# Patient Record
Sex: Male | Born: 1985 | Race: White | Hispanic: No | Marital: Single | Smoking: Former smoker
Health system: Southern US, Community
[De-identification: ages and names within clinical notes are randomized; demographics above are authoritative.]

## PROBLEM LIST (undated history)

## (undated) DIAGNOSIS — D332 Benign neoplasm of brain, unspecified: Secondary | ICD-10-CM

## (undated) HISTORY — PX: OTHER SURGICAL HISTORY: SHX169

## (undated) HISTORY — DX: Benign neoplasm of brain, unspecified: D33.2

## (undated) HISTORY — PX: BRAIN SURGERY: SHX531

---
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
## 2007-05-17 ENCOUNTER — Emergency Department (HOSPITAL_COMMUNITY): Admission: EM | Admit: 2007-05-17 | Discharge: 2007-05-17 | Payer: Self-pay | Admitting: Emergency Medicine

## 2009-02-17 ENCOUNTER — Emergency Department (HOSPITAL_COMMUNITY): Admission: EM | Admit: 2009-02-17 | Discharge: 2009-02-17 | Payer: Self-pay | Admitting: Emergency Medicine

## 2009-05-11 ENCOUNTER — Emergency Department (HOSPITAL_COMMUNITY): Admission: EM | Admit: 2009-05-11 | Discharge: 2009-05-11 | Payer: Self-pay | Admitting: Emergency Medicine

## 2014-06-15 ENCOUNTER — Encounter (HOSPITAL_COMMUNITY): Payer: Self-pay

## 2014-06-15 ENCOUNTER — Emergency Department (HOSPITAL_COMMUNITY): Payer: Self-pay

## 2014-06-15 ENCOUNTER — Emergency Department (HOSPITAL_COMMUNITY)
Admission: EM | Admit: 2014-06-15 | Discharge: 2014-06-15 | Disposition: A | Discharge status: Home / Self Care | Payer: Self-pay | Attending: Emergency Medicine | Admitting: Emergency Medicine

## 2014-06-15 DIAGNOSIS — Y998 Other external cause status: Secondary | ICD-10-CM | POA: Insufficient documentation

## 2014-06-15 DIAGNOSIS — Y9389 Activity, other specified: Secondary | ICD-10-CM | POA: Insufficient documentation

## 2014-06-15 DIAGNOSIS — Y9289 Other specified places as the place of occurrence of the external cause: Secondary | ICD-10-CM | POA: Insufficient documentation

## 2014-06-15 DIAGNOSIS — M25531 Pain in right wrist: Secondary | ICD-10-CM

## 2014-06-15 DIAGNOSIS — W01198A Fall on same level from slipping, tripping and stumbling with subsequent striking against other object, initial encounter: Secondary | ICD-10-CM | POA: Insufficient documentation

## 2014-06-15 DIAGNOSIS — Z720 Tobacco use: Secondary | ICD-10-CM | POA: Insufficient documentation

## 2014-06-15 DIAGNOSIS — S6991XA Unspecified injury of right wrist, hand and finger(s), initial encounter: Secondary | ICD-10-CM | POA: Insufficient documentation

## 2014-06-15 MED ORDER — IBUPROFEN 400 MG PO TABS
600.0000 mg | ORAL_TABLET | Freq: Once | ORAL | Status: AC
  Administered 2014-06-15: 600 mg via ORAL
  Filled 2014-06-15: qty 2

## 2014-06-15 NOTE — ED Notes (Signed)
Pt. Reports tried to catch self from fall with right arm x5 days. States initially no problems but continued to use arm and x3 days ago started having pain to posterior part of hand and right wrist/forearm. Swelling noted to right forearm. Reports tingling to fingers, denies complete loss of sensation, cap refill intact, radial pulse intact.

## 2014-06-15 NOTE — ED Notes (Signed)
Patient transported to X-ray 

## 2014-06-15 NOTE — ED Provider Notes (Signed)
CSN: [id number]     Arrival date & time 06/15/14  1522 History  This chart was scribed for non-physician practitioner, Al Corpus, PA-C, working with Orpah Greek, MD, by Jeanell Sparrow, ED Scribe. This patient was seen in room TR07C/TR07C and the patient's care was started at 4:14 PM.  Chief Complaint  Patient presents with  . Wrist Injury   Patient is a 29 y.o. male presenting with wrist injury. The history is provided by the patient. No language interpreter was used.  Wrist Injury Associated symptoms: no fever    HPI Comments: Justin Lynch is a 29 y.o. male who presents to the Emergency Department complaining of right wrist injury that occurred about 5 days ago. He reports that he playing with his dog when he fell over the dog, hit a tree, and put out his right hand before he hit the ground 5 days ago. He states the he did not have any pain until 3 days ago when he started having constant moderate right wrist pain with swelling. He states that activity with his right hand exacerbates the pain. He reports taking ibuprofen with some relief. He reports having some tingling in his right wrist. 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 He denies any complete loss of sensation, weakness, or any other symptoms.   History reviewed. No pertinent past medical history. Past Surgical History  Procedure Laterality Date  . Brain tumor removal     No family history on file. History  Substance Use Topics  . Smoking status: Current Some Day Smoker  . Smokeless tobacco: Not on file  . Alcohol Use: No    Review of Systems  Constitutional: Negative for fever and chills.  Musculoskeletal: Positive for myalgias, joint swelling and arthralgias.  Neurological: Negative for weakness and numbness.     Allergies  Review of patient's allergies indicates no known allergies.  Home Medications   Prior to Admission medications   Medication Sig Start Date End Date  Taking? Authorizing Provider  ibuprofen (ADVIL,MOTRIN) 800 MG tablet Take 800 mg by mouth every 8 (eight) hours as needed.   Yes Historical Provider, MD   BP 124/54 mmHg  Pulse 47  Temp(Src) 97.9 F (36.6 C)  Resp 16  Ht 6\' 2"  (1.88 m)  Wt 212 lb (96.163 kg)  BMI 27.21 kg/m2  SpO2 99% Physical Exam  Constitutional: He appears well-developed and well-nourished. No distress.  HENT:  Head: Normocephalic and atraumatic.  Eyes: Conjunctivae are normal. Right eye exhibits no discharge. Left eye exhibits no discharge.  Cardiovascular:  Discussed radial pulses equal bilaterally.  Pulmonary/Chest: Effort normal. No respiratory distress.  Musculoskeletal:  Diffuse TTP of right dorsal wrist and thumb including  right scaphoid.  Full range of motion. No swelling or erythema no red streaks.  Neurological: He is alert. Coordination normal.  5/5 strength in bilateral upper extremity sensation intact.   Skin: He is not diaphoretic.  Psychiatric: He has a normal mood and affect. His behavior is normal.  Nursing note and vitals reviewed.   ED Course  Procedures (including critical care time) DIAGNOSTIC STUDIES: Oxygen Saturation is 99% on RA, normal by my interpretation.    COORDINATION OF CARE: 4:18 PM- Pt advised of plan for treatment which includes medication and radiology and pt agrees.  Labs Review Labs Reviewed - No data to display  Imaging Review Dg Forearm Right  06/15/2014   CLINICAL DATA:  [place] 5 days ago.  Persistent pain and swelling.  EXAM: RIGHT WRIST - COMPLETE 3+ VIEW; RIGHT FOREARM - 2 VIEW  COMPARISON:  Wrist films from 2005.  FINDINGS: Right forearm: The wrist and elbow joints are maintained. No acute forearm fracture.  Right wrist: The joint spaces are maintained. No acute fracture is identified. Small density projecting off the dorsal aspect of the first carpal row appears smoothly marginated and well corticated. This is likely a remote avulsion fracture.  IMPRESSION: No  acute fracture.  Probable remote triquetrum avulsion fracture.   Electronically Signed   By: Marijo Sanes M.D.   On: 06/15/2014 16:59   Dg Wrist Complete Right  06/15/2014   CLINICAL DATA:  [place] 5 days ago.  Persistent pain and swelling.  EXAM: RIGHT WRIST - COMPLETE 3+ VIEW; RIGHT FOREARM - 2 VIEW  COMPARISON:  Wrist films from 2005.  FINDINGS: Right forearm: The wrist and elbow joints are maintained. No acute forearm fracture.  Right wrist: The joint spaces are maintained. No acute fracture is identified. Small density projecting off the dorsal aspect of the first carpal row appears smoothly marginated and well corticated. This is likely a remote avulsion fracture.  IMPRESSION: No acute fracture.  Probable remote triquetrum avulsion fracture.   Electronically Signed   By: Marijo Sanes M.D.   On: 06/15/2014 16:59   Dg Hand Complete Right  06/15/2014   CLINICAL DATA:  Acute right hand pain and swelling after fall 5 days ago. Initial encounter.  EXAM: RIGHT HAND - COMPLETE 3+ VIEW  COMPARISON:  None.  FINDINGS: There is no evidence of fracture or dislocation. There is no evidence of arthropathy or other focal bone abnormality. Soft tissues are unremarkable.  IMPRESSION: Normal right hand.   Electronically Signed   By: Marijo Conception, M.D.   On: 06/15/2014 16:58     EKG Interpretation None      MDM   Final diagnoses:  Right wrist pain  Right wrist injury, initial encounter   Patient X-Ray negative for obvious fracture or dislocation. Pain managed in ED. Pt without erythema, edema or warmth to joint. Neurovascularly intact. I doubt septic arthritis. Patient with tenderness to right scaphoid however he is 5 days out from injury and there is no radiological evidence of scaphoid fracture and I doubt injury. Patient given brace and follow up with Ortho/hands for persistent symptoms.  Patient given brace while in ED, RICE, ibuprofen and conservative therapy recommended and discussed.   Discussed  return precautions with patient. Discussed all results and patient verbalizes understanding and agrees with plan.  I personally performed the services described in this documentation, which was scribed in my presence. The recorded information has been reviewed and is accurate.   Al Corpus, PA-C 06/15/14 [place], MD 06/15/14 [phone]

## 2014-06-15 NOTE — Discharge Instructions (Signed)
Return to the emergency room with worsening of symptoms, new symptoms or with symptoms that are concerning, especially numbness, tingling, weakness, fevers, redness, swelling, red streaks. RICE: Rest, Ice (three cycles of 20 mins on, 63mins off at least twice a day), compression/brace, elevation. Heating pad works well for back pain. Ibuprofen 400mg  (2 tablets 200mg ) every 5-6 hours for 3-5 days. Wear wrist brace as much as possible. Follow up with PCP/orthopedist if symptoms worsen or are persistent. Read below information and follow recommendations. Wrist Pain Wrist injuries are frequent in adults and children. A sprain is an injury to the ligaments that hold your bones together. A strain is an injury to muscle or muscle cord-like structures (tendons) from stretching or pulling. Generally, when wrists are moderately tender to touch following a fall or injury, a break in the bone (fracture) may be present. Most wrist sprains or strains are better in 3 to 5 days, but complete healing may take several weeks. HOME CARE INSTRUCTIONS   Put ice on the injured area.  Put ice in a plastic bag.  Place a towel between your skin and the bag.  Leave the ice on for 15-20 minutes, 3-4 times a day, for the first 2 days, or as directed by your health care provider.  Keep your arm raised above the level of your heart whenever possible to reduce swelling and pain.  Rest the injured area for at least 48 hours or as directed by your health care provider.  If a splint or elastic bandage has been applied, use it for as long as directed by your health care provider or until seen by a health care provider for a follow-up exam.  Only take over-the-counter or prescription medicines for pain, discomfort, or fever as directed by your health care provider.  Keep all follow-up appointments. You may need to follow up with a specialist or have follow-up X-rays. Improvement in pain level is not a guarantee that you did  not fracture a bone in your wrist. The only way to determine whether or not you have a broken bone is by X-ray. SEEK IMMEDIATE MEDICAL CARE IF:   Your fingers are swollen, very red, white, or cold and blue.  Your fingers are numb or tingling.  You have increasing pain.  You have difficulty moving your fingers. MAKE SURE YOU:   Understand these instructions.  Will watch your condition.  Will get help right away if you are not doing well or get worse. Document Released: 12/07/2004 Document Revised: 03/04/2013 Document Reviewed: 04/20/2010 [facility] Patient Information 2015 [place], [place]. This information is not intended to replace advice given to you by your health care provider. Make sure you discuss any questions you have with your health care provider.

## 2014-06-15 NOTE — ED Notes (Signed)
Returned from XRAY

## 2015-08-25 ENCOUNTER — Ambulatory Visit (INDEPENDENT_AMBULATORY_CARE_PROVIDER_SITE_OTHER): Payer: Managed Care, Other (non HMO) | Admitting: Physician Assistant

## 2015-08-25 VITALS — BP 140/80 | HR 61 | Temp 97.5°F | Resp 15 | Ht 74.0 in | Wt 205.0 lb

## 2015-08-25 DIAGNOSIS — K0889 Other specified disorders of teeth and supporting structures: Secondary | ICD-10-CM

## 2015-08-25 MED ORDER — IBUPROFEN 800 MG PO TABS: 800.0000 mg | ORAL_TABLET | Freq: Three times a day (TID) | ORAL | Status: AC | PRN

## 2015-08-25 MED ORDER — TRAMADOL HCL 50 MG PO TABS: 50.0000 mg | ORAL_TABLET | Freq: Three times a day (TID) | ORAL | Status: DC | PRN

## 2015-08-25 NOTE — Progress Notes (Signed)
[facility] [address] [place] [address] [phone]  Date:  08/25/2015   Name:  Justin Lynch   DOB:  1985-07-08   MRN:  [id number]  PCP:  No primary care provider on file.    History of Present Illness:  Justin Lynch is a 30 y.o. male patient who presents to [facility] for cc dental pain.   Patient states that he has had 7-8 months of dental pain.  This has wax and waned.  He states that he was eating last night when he bit down on a broken tooth.  Pain was instant, and radiated up into his upper left cheek.  He has attempted aleve without much relief.  No swelling.  He has had no fever.     There are no active problems to display for this patient.   Past Medical History  Diagnosis Date  . Brain tumor (benign) [facility])     Past Surgical History  Procedure Laterality Date  . Brain tumor removal    . Brain surgery      Social History  Substance Use Topics  . Smoking status: Current Some Day Smoker  . Smokeless tobacco: None  . Alcohol Use: No    No family history on file.  No Known Allergies  Medication list has been reviewed and updated.  No current outpatient prescriptions on file prior to visit.   No current facility-administered medications on file prior to visit.    ROS ROS otherwise unremarkable unless listed above.   Physical Examination: BP 140/80 mmHg  Pulse 61  Temp(Src) 97.5 F (36.4 C) (Oral)  Resp 15  Ht 6\' 2"  (1.88 m)  Wt 205 lb (92.987 kg)  BMI 26.31 kg/m2  SpO2 98% Ideal Body Weight: Weight in (lb) to have BMI = 25: 194.3  Physical Exam  Constitutional: He is oriented to person, place, and time. He appears well-developed and well-nourished. No distress.  HENT:  Head: Normocephalic and atraumatic.  Mouth/Throat: Abnormal dentition. Dental caries present. No uvula swelling. No oropharyngeal exudate, posterior oropharyngeal edema or posterior oropharyngeal erythema.  Some tenderness at the left upper back molar.   Eyes: Conjunctivae and EOM are normal. Pupils are equal, round, and reactive to light.  Cardiovascular: Normal rate.   Pulmonary/Chest: Effort normal. No respiratory distress.  Neurological: He is alert and oriented to person, place, and time.  Skin: Skin is warm and dry. He is not diaphoretic.  Psychiatric: He has a normal mood and affect. His behavior is normal.     Assessment and Plan: Justin Lynch is a 30 y.o. male who is here today for  Tooth pain. -ibuprofen given today.  Declines anything stronger.  I have advised some choices for dental visit.    1. Tooth pain - ibuprofen (ADVIL,MOTRIN) 800 MG tablet; Take 1 tablet (800 mg total) by mouth every 8 (eight) hours as needed.  Dispense: 60 tablet; Refill: 0   Ivar Drape, PA-C [facility] 08/25/2015 3:02 PM

## 2015-08-25 NOTE — Patient Instructions (Addendum)
     IF you received an x-ray today, you will receive an invoice from [facility]. Please contact [facility] at [phone] with questions or concerns regarding your invoice.   IF you received labwork today, you will receive an invoice from [employer]. Please contact [facility] at [phone] with questions or concerns regarding your invoice.   Our billing staff will not be able to assist you with questions regarding bills from these companies.  You will be contacted with the lab results as soon as they are available. The fastest way to get your results is to activate your My Chart account. Instructions are located on the last page of this paperwork. If you have not heard from [place] regarding the results in 2 weeks, please contact this office.    Dental Pain Dental pain may be caused by many things, including:  Tooth decay (cavities or caries). Cavities expose the nerve of your tooth to air and hot or cold temperatures. This can cause pain or discomfort.  Abscess or infection. A dental abscess is a collection of infected pus from a bacterial infection in the inner part of the tooth (pulp). It usually occurs at the end of the tooth's root.  Injury.  An unknown reason (idiopathic). Your pain may be mild or severe. It may only occur when:  You are chewing.  You are exposed to hot or cold temperature.  You are eating or drinking sugary foods or beverages, such as soda or candy. Your pain may also be constant. HOME CARE INSTRUCTIONS Watch your dental pain for any changes. The following actions may help to lessen any discomfort that you are feeling:  Take medicines only as directed by your dentist.  If you were prescribed an antibiotic medicine, finish all of it even if you start to feel better.  Keep all follow-up visits as directed by your dentist. This is important.  Do not apply heat to the outside of your face.  Rinse your mouth or  gargle with salt water if directed by your dentist. This helps with pain and swelling.  You can make salt water by adding  tsp of salt to 1 cup of warm water.  Apply ice to the painful area of your face:  Put ice in a plastic bag.  Place a towel between your skin and the bag.  Leave the ice on for 20 minutes, 2-3 times per day.  Avoid foods or drinks that cause you pain, such as:  Very hot or very cold foods or drinks.  Sweet or sugary foods or drinks. SEEK MEDICAL CARE IF:  Your pain is not controlled with medicines.  Your symptoms are worse.  You have new symptoms. SEEK IMMEDIATE MEDICAL CARE IF:  You are unable to open your mouth.  You are having trouble breathing or swallowing.  You have a fever.  Your face, neck, or jaw is swollen.   This information is not intended to replace advice given to you by your health care provider. Make sure you discuss any questions you have with your health care provider.   Document Released: 02/27/2005 Document Revised: 07/14/2014 Document Reviewed: 02/23/2014 Elsevier Interactive Patient Education [employer].

## 2015-11-13 ENCOUNTER — Encounter (HOSPITAL_COMMUNITY): Payer: Self-pay | Admitting: Nurse Practitioner

## 2015-11-13 ENCOUNTER — Inpatient Hospital Stay (HOSPITAL_COMMUNITY): Payer: Managed Care, Other (non HMO)

## 2015-11-13 ENCOUNTER — Inpatient Hospital Stay (HOSPITAL_COMMUNITY)
Admission: EM | Admit: 2015-11-13 | Discharge: 2015-11-15 | DRG: 201 | Disposition: A | Discharge status: Home / Self Care | Payer: Managed Care, Other (non HMO) | Attending: Pulmonary Disease | Admitting: Pulmonary Disease

## 2015-11-13 ENCOUNTER — Emergency Department (HOSPITAL_COMMUNITY): Payer: Managed Care, Other (non HMO)

## 2015-11-13 DIAGNOSIS — J9600 Acute respiratory failure, unspecified whether with hypoxia or hypercapnia: Secondary | ICD-10-CM

## 2015-11-13 DIAGNOSIS — J939 Pneumothorax, unspecified: Secondary | ICD-10-CM

## 2015-11-13 DIAGNOSIS — J9383 Other pneumothorax: Secondary | ICD-10-CM | POA: Diagnosis present

## 2015-11-13 DIAGNOSIS — R0602 Shortness of breath: Secondary | ICD-10-CM | POA: Diagnosis present

## 2015-11-13 DIAGNOSIS — J9311 Primary spontaneous pneumothorax: Secondary | ICD-10-CM | POA: Diagnosis present

## 2015-11-13 DIAGNOSIS — Z86011 Personal history of benign neoplasm of the brain: Secondary | ICD-10-CM

## 2015-11-13 DIAGNOSIS — Z91048 Other nonmedicinal substance allergy status: Secondary | ICD-10-CM

## 2015-11-13 DIAGNOSIS — F17200 Nicotine dependence, unspecified, uncomplicated: Secondary | ICD-10-CM | POA: Diagnosis present

## 2015-11-13 LAB — BASIC METABOLIC PANEL
ANION GAP: 10 (ref 5–15)
BUN: 17 mg/dL (ref 6–20)
CALCIUM: 9.4 mg/dL (ref 8.9–10.3)
CHLORIDE: 107 mmol/L (ref 101–111)
CO2: 23 mmol/L (ref 22–32)
Creatinine, Ser: 0.7 mg/dL (ref 0.61–1.24)
GFR calc non Af Amer: >60 mL/min (ref >60)
GLUCOSE: 108 mg/dL — AB (ref 65–99)
POTASSIUM: 3.6 mmol/L (ref 3.5–5.1)
Sodium: 140 mmol/L (ref 135–145)

## 2015-11-13 LAB — CBC
HEMATOCRIT: 40.7 % (ref 39.0–52.0)
HEMOGLOBIN: 13.6 g/dL (ref 13.0–17.0)
MCH: 30.6 pg (ref 26.0–34.0)
MCHC: 33.4 g/dL (ref 30.0–36.0)
MCV: 91.5 fL (ref 78.0–100.0)
Platelets: 327 10*3/uL (ref 150–400)
RBC: 4.45 MIL/uL (ref 4.22–5.81)
RDW: 12.6 % (ref 11.5–15.5)
WBC: 8.1 10*3/uL (ref 4.0–10.5)

## 2015-11-13 LAB — I-STAT TROPONIN, ED: TROPONIN I, POC: 0 ng/mL (ref 0.00–0.08)

## 2015-11-13 MED ORDER — FENTANYL CITRATE (PF) 100 MCG/2ML IJ SOLN
INTRAMUSCULAR | Status: AC
  Filled 2015-11-13: qty 2

## 2015-11-13 MED ORDER — SODIUM CHLORIDE 0.9 % IV SOLN
INTRAVENOUS | Status: DC
  Administered 2015-11-13 – 2015-11-14 (×3): via INTRAVENOUS

## 2015-11-13 MED ORDER — HYDROCODONE-ACETAMINOPHEN 5-325 MG PO TABS
1.0000 | ORAL_TABLET | ORAL | Status: DC | PRN
  Administered 2015-11-13 (×2): 2 via ORAL
  Filled 2015-11-13 (×2): qty 2

## 2015-11-13 MED ORDER — KETAMINE HCL-SODIUM CHLORIDE 100-0.9 MG/10ML-% IV SOSY
0.3000 mg/kg | PREFILLED_SYRINGE | Freq: Once | INTRAVENOUS | Status: AC
  Administered 2015-11-13: 20 mg via INTRAVENOUS
  Filled 2015-11-13: qty 10

## 2015-11-13 MED ORDER — NICOTINE 21 MG/24HR TD PT24
21.0000 mg | MEDICATED_PATCH | Freq: Every day | TRANSDERMAL | Status: DC
  Administered 2015-11-13 – 2015-11-15 (×3): 21 mg via TRANSDERMAL
  Filled 2015-11-13 (×3): qty 1

## 2015-11-13 MED ORDER — FENTANYL CITRATE (PF) 100 MCG/2ML IJ SOLN
25.0000 ug | Freq: Once | INTRAMUSCULAR | Status: AC
  Administered 2015-11-13: 25 ug via INTRAVENOUS

## 2015-11-13 MED ORDER — LIDOCAINE-EPINEPHRINE (PF) 2 %-1:200000 IJ SOLN
20.0000 mL | Freq: Once | INTRAMUSCULAR | Status: AC
  Administered 2015-11-13: 20 mL via INTRADERMAL
  Filled 2015-11-13: qty 20

## 2015-11-13 MED ORDER — FENTANYL CITRATE (PF) 100 MCG/2ML IJ SOLN
25.0000 ug | INTRAMUSCULAR | Status: DC | PRN
  Administered 2015-11-13 – 2015-11-15 (×2): 25 ug via INTRAVENOUS
  Filled 2015-11-13 (×4): qty 2

## 2015-11-13 NOTE — ED Notes (Signed)
Returned from X-ray and hooked up to monitor. Dr. Kathrynn Humble at bedside.

## 2015-11-13 NOTE — ED Notes (Signed)
MD at bedside. 

## 2015-11-13 NOTE — Sedation Documentation (Signed)
Parents at bedside.  Pt alert and denying pain.  Tolerated procedure x 2 very well.

## 2015-11-13 NOTE — ED Notes (Signed)
Patient transported to X-ray 

## 2015-11-13 NOTE — H&P (Signed)
Name: Justin Lynch MRN: [id number] DOB: 09/11/1985    ADMISSION DATE:  11/13/2015  REFERRING MD :  EDP (nanavati)   CHIEF COMPLAINT:  Spontaneous Ptx   BRIEF PATIENT DESCRIPTION: 30yo male with remote hx brain tumor (benign) s/p resection presented 9/2 to [facility] ER with sudden onset L chest and arm pain as well as SOB.  CXR revealed large spontaneous ptx.  L pigtail chest tube placed by EDP and PCCM consulted to admit.   SIGNIFICANT EVENTS  9/2 L ptx>> L pigtail placed   STUDIES:  9/2 CXR>>> Large left pneumothorax extending from the apex to the base.   HISTORY OF PRESENT ILLNESS:  30yo male with remote hx brain tumor (benign) s/p resection presented 9/2 to [facility] ER with sudden onset L chest and arm pain as well as SOB.  CXR revealed large spontaneous ptx.  L pigtail chest tube placed by EDP and PCCM consulted to admit.   No hx trauma.  C/o mild SOB, dizziness, pain at CT site.  Denies cough, hemoptysis, fever, syncope.  Current smoker - tobacco and marijuana.     PAST MEDICAL HISTORY :   has a past medical history of Brain tumor (benign) ([place]).  has a past surgical history that includes brain tumor removal and Brain surgery. Prior to Admission medications   Not on File   No Known Allergies  FAMILY HISTORY:  family history is not on file. SOCIAL HISTORY:  reports that he has been smoking.  He has never used smokeless tobacco. He reports that he uses drugs, including Marijuana. He reports that he does not drink alcohol.  REVIEW OF SYSTEMS:   As per HPI - All other systems reviewed and were neg.    SUBJECTIVE:   VITAL SIGNS: Temp:  [98.2 F (36.8 C)] 98.2 F (36.8 C) (09/02 1111) Pulse Rate:  [41-56] 46 (09/02 1415) Resp:  [9-23] 16 (09/02 1415) BP: (125-153)/(72-97) 149/84 (09/02 1415) SpO2:  [95 %-100 %] 100 % (09/02 1415) Weight:  [93 kg (205 lb 0.4 oz)] 93 kg (205 lb 0.4 oz) (09/02 1149)  PHYSICAL EXAMINATION: General:  Pleasant young male, NAD  Neuro:  Awake,  alert, appropriate, MAE  HEENT:  Mm moist, no JVD  Cardiovascular:  s1s2 rrr, brady  Lungs:  resps even non labored on RA, clear bilat  Abdomen:  Round, soft, +bs  Musculoskeletal:  Warm and dry, no edema    Recent Labs Lab 11/13/15 1115  NA 140  K 3.6  CL 107  CO2 23  BUN 17  CREATININE 0.70  GLUCOSE 108*    Recent Labs Lab 11/13/15 1115  HGB 13.6  HCT 40.7  WBC 8.1  PLT 327   Dg Chest 2 View  Result Date: 11/13/2015 CLINICAL DATA:  Left chest pain and shortness of breath EXAM: CHEST  2 VIEW COMPARISON:  None. Patient's prior chest x-ray is not available for comparison. FINDINGS: The mediastinal contour and cardiac silhouette are normal. There is a large left pneumothorax extending from the apex to the base. The right lung is normal. The bones are normal. IMPRESSION: Large left pneumothorax extending from the apex to the base. These results were called by telephone at the time of interpretation on 11/13/2015 at 12:13 pm to Dr. Varney Biles , who verbally acknowledged these results. Electronically Signed   By: Abelardo Diesel M.D.   On: 11/13/2015 12:16    ASSESSMENT / PLAN:  Spontaneous LEFT pneumothorax - No ptx on post chest tube film  Tobacco abuse   PLAN -  Admit to floor  Chest tube to 20cm suction  CXR in am  Pain control  Smoking cessation discussed    Justin Madrid, NP 11/13/2015  2:45 PM Pager: [phone] or [phone]  Attending note: I have seen and examined the patient with nurse practitioner/resident and agree with the note. History, labs and imaging reviewed.  30 Y/O with PMH of remote benign brain tumor s/p resection, smoker admitted with Lt spontaneous pneumothorax s/p pigtail placement by ED. Pt is stable. He will be admitted to hospital Continue CT to 20 cm suction.  Repeat CXR in AM.  Justin Garfinkel MD [facility] Pager [phone] If no answer or after 3pm call: [phone] 11/13/2015, 3:52 PM

## 2015-11-13 NOTE — ED Provider Notes (Signed)
[facility] DEPT Provider Note   CSN: [id number] Arrival date & time: 11/13/15  1105     History   Chief Complaint Chief Complaint  Patient presents with  . Chest Pain    HPI Justin Lynch is a 30 y.o. male.  HPI 30yo male with remote hx brain tumor (benign) s/p resection presents to the ER with sudden onset L chest and arm pain as well as SOB. Pt reports that when he woke up he was doing well, but while at work he started having chest pain, which was getting worse and started affecting his shoulder, so he decided to come to the ER. Pt has no asthma/copd hx, denies drug use. No heavy smoking. Pt denies trauma.  Past Medical History:  Diagnosis Date  . Brain tumor (benign) [facility])     Patient Active Problem List   Diagnosis Date Noted  . Spontaneous pneumothorax 11/13/2015    Past Surgical History:  Procedure Laterality Date  . BRAIN SURGERY    . brain tumor removal         Home Medications    Prior to Admission medications   Medication Sig Start Date End Date Taking? Authorizing Provider  ibuprofen (ADVIL,MOTRIN) 800 MG tablet Take 800 mg by mouth daily as needed (stress headache).   Yes Historical Provider, MD    Family History History reviewed. No pertinent family history.  Social History Social History  Substance Use Topics  . Smoking status: Current Some Day Smoker  . Smokeless tobacco: Never Used  . Alcohol use No     Allergies   Powder   Review of Systems Review of Systems  ROS 10 Systems reviewed and are negative for acute change except as noted in the HPI.     Physical Exam Updated Vital Signs BP 140/77 (BP Location: Right Arm)   Pulse (!) 51   Temp 98.8 F (37.1 C) (Oral)   Resp 19   Ht 6' 2.02" (1.88 m)   Wt 205 lb 0.4 oz (93 kg)   SpO2 98%   BMI 26.31 kg/m   Physical Exam  Constitutional: He is oriented to person, place, and time. He appears well-developed.  HENT:  Head: Atraumatic.  Neck: Neck supple.    Cardiovascular: Normal rate.   Pulmonary/Chest: Effort normal. No respiratory distress. He has no wheezes.  Diminished breath sounds on the L side.  Neurological: He is alert and oriented to person, place, and time.  Skin: Skin is warm.  Nursing note and vitals reviewed.    ED Treatments / Results  Labs (all labs ordered are listed, but only abnormal results are displayed) Labs Reviewed  BASIC METABOLIC PANEL - Abnormal; Notable for the following:       Result Value   Glucose, Bld 108 (*)    All other components within normal limits  CBC  I-STAT TROPOININ, ED    EKG  EKG Interpretation None       Radiology Dg Chest 2 View  Result Date: 11/13/2015 CLINICAL DATA:  Left chest pain and shortness of breath EXAM: CHEST  2 VIEW COMPARISON:  None. Patient's prior chest x-ray is not available for comparison. FINDINGS: The mediastinal contour and cardiac silhouette are normal. There is a large left pneumothorax extending from the apex to the base. The right lung is normal. The bones are normal. IMPRESSION: Large left pneumothorax extending from the apex to the base. These results were called by telephone at the time of interpretation on 11/13/2015 at 12:13 pm  to Dr. Varney Biles , who verbally acknowledged these results. Electronically Signed   By: Abelardo Diesel M.D.   On: 11/13/2015 12:16   Dg Chest Port 1 View  Result Date: 11/13/2015 CLINICAL DATA:  Follow-up pneumothorax and chest tube placement. EXAM: PORTABLE CHEST 1 VIEW COMPARISON:  11/13/2015 FINDINGS: Mild cardiomegaly again noted. A left pleural catheter has been minimally retracted since the prior study. Slightly increasing left lateral subcutaneous emphysema identified. There is no evidence of pneumothorax. Mild left basilar atelectasis is present. IMPRESSION: Left pleural catheter has been minimally retracted since the prior study with slightly increasing left lateral subcutaneous emphysema. No evidence of pneumothorax. Mild  cardiomegaly with mild left basilar atelectasis. Electronically Signed   By: Margarette Canada M.D.   On: 11/13/2015 16:01   Dg Chest Port 1 View  Result Date: 11/13/2015 CLINICAL DATA:  Followup pneumothorax. EXAM: PORTABLE CHEST 1 VIEW COMPARISON:  Earlier film, same date. FINDINGS: There is a pigtail type drainage catheter in the left pleural space a small residual left-sided pneumothorax estimated at 10%. Left lower lobe atelectasis is noted. Small amount of subcutaneous emphysema. The right lung remains clear. IMPRESSION: Re-expansion of the left lung after placement of a pleural drainage catheter. There is a small residual pneumothorax estimated at 10%. Streaky areas of atelectasis in the left lung Electronically Signed   By: Marijo Sanes M.D.   On: 11/13/2015 14:44    Procedures .Critical Care Performed by: Varney Biles Authorized by: Varney Biles   Critical care provider statement:    Critical care time (minutes):  40   Critical care time was exclusive of:  Separately billable procedures and treating other patients   Critical care was necessary to treat or prevent imminent or life-threatening deterioration of the following conditions:  Respiratory failure   Critical care was time spent personally by me on the following activities:  Blood draw for specimens, development of treatment plan with patient or surrogate, discussions with consultants, evaluation of patient's response to treatment, examination of patient, obtaining history from patient or surrogate, ordering and performing treatments and interventions, ordering and review of laboratory studies, ordering and review of radiographic studies, pulse oximetry and re-evaluation of patient's condition CHEST TUBE INSERTION Date/Time: 11/13/2015 5:02 PM Performed by: Varney Biles Authorized by: Varney Biles   Consent:    Consent obtained:  Written   Consent given by:  Patient   Risks discussed:  Bleeding, incomplete drainage, nerve  damage, infection and damage to surrounding structures   Alternatives discussed:  Alternative treatment Universal protocol:    Procedure explained and questions answered to patient or proxy's satisfaction: yes     Relevant documents present and verified: yes     Imaging studies available: yes     Site/side marked: yes     Immediately prior to procedure a time out was called: yes   Pre-procedure details:    Skin preparation:  ChloraPrep Anesthesia (see MAR for exact dosages):    Anesthesia method:  Local infiltration   Local anesthetic:  Lidocaine 2% WITH epi Procedure details:    Placement location:  L anterior   Scalpel size:  10   Tube size (Fr):  8   Ultrasound guidance: no     Tension pneumothorax: no     Tube connected to:  Suction   Drainage characteristics:  Air only   Suture material:  2-0 silk   Dressing:  Petrolatum-impregnated gauze and 4x4 sterile gauze Post-procedure details:    Post-insertion x-ray findings: tube in  good position     Patient tolerance of procedure:  Tolerated well, no immediate complications   (including critical care time)  Medications Ordered in ED Medications  0.9 %  sodium chloride infusion ( Intravenous Transfusing/Transfer 11/13/15 1547)  HYDROcodone-acetaminophen (NORCO/VICODIN) 5-325 MG per tablet 1-2 tablet (2 tablets Oral Given 11/13/15 1545)  nicotine (NICODERM CQ - dosed in mg/24 hours) patch 21 mg (21 mg Transdermal Patch Applied 11/13/15 1521)  lidocaine-EPINEPHrine (XYLOCAINE W/EPI) 2 %-1:200000 (PF) injection 20 mL (20 mLs Intradermal Given 11/13/15 1325)  ketamine 100 mg in normal saline 10 mL (10mg /mL) syringe (20 mg Intravenous Given 11/13/15 1416)  fentaNYL (SUBLIMAZE) injection 25 mcg (25 mcg Intravenous Given 11/13/15 1552)     Initial Impression / Assessment and Plan / ED Course  I have reviewed the triage vital signs and the nursing notes.  Pertinent labs & imaging results that were available during my care of the patient were  reviewed by me and considered in my medical decision making (see chart for details).  Clinical Course    Pt comes in with chest pain, dib and is noted to have a pneumothorax. PTX > 50% and > 3 cm, this intervention was mandated. PT also had severe chest pain and dib. We decided to use pig tail catheter, as it was primary spontaneous PTX. Pt tolerated the procedure well.  Pt aware that if the pig tail catheter doesn't work, he will need a 20 Fr tube thoracostomy. Pulmonary team admitting.   Final Clinical Impressions(s) / ED Diagnoses   Final diagnoses:  Primary spontaneous pneumothorax    New Prescriptions Current Discharge Medication List       Varney Biles, MD 11/13/15 1706

## 2015-11-13 NOTE — ED Triage Notes (Signed)
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 He is diaphoretic and feels SOB. Symptoms have been constant since onset. Nothing seems to relieve or aggravate the pain. He denies cough, fevers. He felt dizzy at onset of pain.

## 2015-11-13 NOTE — ED Notes (Signed)
Attempted report 

## 2015-11-13 NOTE — ED Notes (Signed)
Pt prepared and ready for chest tube insertion.  Pt signed consent.  Dr Kathrynn Humble and med student as well as RN and EDT at bedside.

## 2015-11-14 ENCOUNTER — Inpatient Hospital Stay (HOSPITAL_COMMUNITY): Payer: Managed Care, Other (non HMO)

## 2015-11-14 LAB — CBC
HEMATOCRIT: 35.8 % — AB (ref 39.0–52.0)
HEMOGLOBIN: 12 g/dL — AB (ref 13.0–17.0)
MCH: 30.5 pg (ref 26.0–34.0)
MCHC: 33.5 g/dL (ref 30.0–36.0)
MCV: 91.1 fL (ref 78.0–100.0)
Platelets: 275 10*3/uL (ref 150–400)
RBC: 3.93 MIL/uL — AB (ref 4.22–5.81)
RDW: 12.6 % (ref 11.5–15.5)
WBC: 9.8 10*3/uL (ref 4.0–10.5)

## 2015-11-14 MED ORDER — IBUPROFEN 200 MG PO TABS
100.0000 mg | ORAL_TABLET | Freq: Three times a day (TID) | ORAL | Status: DC | PRN
  Administered 2015-11-14 (×2): 100 mg via ORAL
  Filled 2015-11-14 (×2): qty 1

## 2015-11-14 MED ORDER — ONDANSETRON HCL 4 MG/2ML IJ SOLN
4.0000 mg | Freq: Four times a day (QID) | INTRAMUSCULAR | Status: DC | PRN
  Administered 2015-11-14: 4 mg via INTRAVENOUS
  Filled 2015-11-14: qty 2

## 2015-11-14 MED ORDER — IBUPROFEN 800 MG PO TABS
800.0000 mg | ORAL_TABLET | Freq: Three times a day (TID) | ORAL | Status: DC | PRN
  Filled 2015-11-14: qty 1

## 2015-11-14 NOTE — Progress Notes (Signed)
[facility] Progress Note Patient Name: Justin Lynch DOB: Dec 06, 1985 MRN: [id number]   Date of Service  11/14/2015  HPI/Events of Note  Pt takes advil at home prn and requesting full dose   eICU Interventions  Changed to advil 800 q 8 h prn      Intervention Category Minor Interventions: Routine modifications to care plan (e.g. PRN medications for pain, fever)  Christinia Gully 11/14/2015, 7:33 PM

## 2015-11-14 NOTE — Progress Notes (Signed)
Pt. did not wait for new order of pain med to be given, instead took pain med brought in from home. Pt. and family advised against this.

## 2015-11-14 NOTE — Progress Notes (Signed)
Name: Justin Lynch MRN: [id number] DOB: [birth year]    ADMISSION DATE:  11/13/2015  REFERRING MD :  EDP (nanavati)   CHIEF COMPLAINT:  Spontaneous Ptx   BRIEF PATIENT DESCRIPTION: 29yowm smoker  with remote hx brain tumor (benign) s/p resection presented 9/2 to [facility] ER with sudden onset L chest and arm pain as well as SOB.  CXR revealed large spontaneous ptx.  L pigtail chest tube placed by EDP and PCCM consulted to admit.   SIGNIFICANT EVENTS  9/2 L ptx>> L pigtail placed   STUDIES:      SUBJECTIVE:  [name] lying quietly in bed at 30 degrees HOB nad on RA / no appetite p nausea from vicodin > d/c'd  VITAL SIGNS: Temp:  [97.6 F (36.4 C)-98.8 F (37.1 C)] 97.6 F (36.4 C) (09/03 0546) Pulse Rate:  [41-56] 41 (09/03 0546) Resp:  [9-23] 18 (09/03 0546) BP: (115-153)/(60-97) 115/70 (09/03 0546) SpO2:  [95 %-100 %] 100 % (09/03 0546) Weight:  [205 lb 0.4 oz (93 kg)] 205 lb 0.4 oz (93 kg) (09/02 1149)  PHYSICAL EXAMINATION: General:  Pleasant young male, NAD  Neuro:  Awake, alert, appropriate, MAE  HEENT:  Mm moist, no JVD  Cardiovascular:  s1s2 rrr, brady  Lungs:  resps even non labored on RA, clear bilat / no significant ant crepitance  Abdomen:  Round, soft, +bs  Musculoskeletal:  Warm and dry, no edema    Recent Labs Lab 11/13/15 1115  NA 140  K 3.6  CL 107  CO2 23  BUN 17  CREATININE 0.70  GLUCOSE 108*    Recent Labs Lab 11/13/15 1115 11/14/15 0214  HGB 13.6 12.0*  HCT 40.7 35.8*  WBC 8.1 9.8  PLT 327 275   X-ray Chest Pa And Lateral  Result Date: 11/14/2015 CLINICAL DATA:  Spontaneous pneumothorax, left chest tube EXAM: CHEST  2 VIEW COMPARISON:  11/13/2015 FINDINGS: Stable left pigtail chest tube. No pneumothorax is seen. Moderate subcutaneous emphysema along the left lower chest wall. Right lung is clear. The heart is normal in size. IMPRESSION: Stable left pigtail chest tube with moderate subcutaneous emphysema. No pneumothorax is seen.  Electronically Signed   By: Julian Hy M.D.   On: 11/14/2015 08:13   Dg Chest 2 View  Result Date: 11/13/2015 CLINICAL DATA:  Left chest pain and shortness of breath EXAM: CHEST  2 VIEW COMPARISON:  None. Patient's prior chest x-ray is not available for comparison. FINDINGS: The mediastinal contour and cardiac silhouette are normal. There is a large left pneumothorax extending from the apex to the base. The right lung is normal. The bones are normal. IMPRESSION: Large left pneumothorax extending from the apex to the base. These results were called by telephone at the time of interpretation on 11/13/2015 at 12:13 pm to Dr. Varney Biles , who verbally acknowledged these results. Electronically Signed   By: Abelardo Diesel M.D.   On: 11/13/2015 12:16   Dg Chest Port 1 View  Result Date: 11/13/2015 CLINICAL DATA:  Follow-up pneumothorax and chest tube placement. EXAM: PORTABLE CHEST 1 VIEW COMPARISON:  11/13/2015 FINDINGS: Mild cardiomegaly again noted. A left pleural catheter has been minimally retracted since the prior study. Slightly increasing left lateral subcutaneous emphysema identified. There is no evidence of pneumothorax. Mild left basilar atelectasis is present. IMPRESSION: Left pleural catheter has been minimally retracted since the prior study with slightly increasing left lateral subcutaneous emphysema. No evidence of pneumothorax. Mild cardiomegaly with mild left basilar atelectasis. Electronically Signed  By: Margarette Canada M.D.   On: 11/13/2015 16:01   Dg Chest Port 1 View  Result Date: 11/13/2015 CLINICAL DATA:  Followup pneumothorax. EXAM: PORTABLE CHEST 1 VIEW COMPARISON:  Earlier film, same date. FINDINGS: There is a pigtail type drainage catheter in the left pleural space a small residual left-sided pneumothorax estimated at 10%. Left lower lobe atelectasis is noted. Small amount of subcutaneous emphysema. The right lung remains clear. IMPRESSION: Re-expansion of the left lung after  placement of a pleural drainage catheter. There is a small residual pneumothorax estimated at 10%. Streaky areas of atelectasis in the left lung Electronically Signed   By: Marijo Sanes M.D.   On: 11/13/2015 14:44    ASSESSMENT / PLAN:  Spontaneous LEFT pneumothorax - No ptx on post chest tube film  Cigarette smoker   PLAN -   Chest tube to 20cm suction  CXR in am  Pain control with nsaids/ d/c'd vicodine 9/3 due to nausea Smoking cessation discussed    Discussed plan of care with mother at bedside > continue suction another 24 h   Christinia Gully, MD Pulmonary and [place] [phone] After 5:30 PM or weekends, call [phone]

## 2015-11-14 NOTE — Progress Notes (Signed)
[facility] Progress Note Patient Name: Justin Lynch DOB: [birth year] MRN: [id number]   Date of Service  11/14/2015  HPI/Events of Note  Patient is nauseated most likely from Vicodin he said.   eICU Interventions  Try Motrin when necessary for pain.  Zofran when necessary for nausea.  May need to hold off on Vicodin.      Intervention Category Intermediate Interventions: Other:  [place] 11/14/2015, 2:28 AM

## 2015-11-15 ENCOUNTER — Encounter: Payer: Self-pay | Admitting: Adult Health

## 2015-11-15 ENCOUNTER — Inpatient Hospital Stay (HOSPITAL_COMMUNITY): Payer: Managed Care, Other (non HMO)

## 2015-11-15 DIAGNOSIS — J939 Pneumothorax, unspecified: Secondary | ICD-10-CM

## 2015-11-15 NOTE — Discharge Summary (Signed)
Physician Discharge Summary  Patient ID: FITZGERALD DUNNE MRN: [id number] DOB/AGE: 07/06/1985 30 y.o.  Admit date: 11/13/2015 Discharge date: 11/15/2015    Discharge Diagnoses:  Active Problems:   Spontaneous pneumothorax   Pneumothorax on left                                                       D/c plan by Discharge Diagnosis   Brief Summary: 30yowm smoker  with remote hx brain tumor (benign) s/p resection presented 9/2 to [facility] ER with sudden onset L chest and arm pain as well as SOB.  CXR revealed large spontaneous ptx.  L pigtail chest tube placed by EDP and PCCM consulted to admit.  CXR without ptx once chest tube placed.  He was transitioned to water seal on 9/3, chest tube removed on 9/4 and CXR 4 hours post removal remains without ptx.  He is ambulatory in the room, requiring ibuprofen only for pain management.  He has been extensively counseled on smoking cessation.  Has also been counseled on s/s that should prompt return to ER.  He is medically cleared for d/c pending outpt pulmonary f/u with CXR.       SIGNIFICANT EVENTS  9/2 L ptx>> L pigtail placed > removed 11/15/15     Vitals:   11/14/15 1841 11/14/15 2058 11/15/15 0458 11/15/15 1324  BP: (!) 145/63 (!) 156/86 124/61 (!) 153/62  Pulse: (!) 46 (!) 49 (!) 40 (!) 52  Resp: _0 Temp: 98.4 F (36.9 C) 98.2 F (36.8 C) 97.6 F (36.4 C) 98 F (36.7 C)  TempSrc: Oral Oral Oral Oral  SpO2: 99% 99% 99% 98%  Weight:      Height:         Discharge Labs  BMET  Recent Labs Lab 11/13/15 1115  NA 140  K 3.6  CL 107  CO2 23  GLUCOSE 108*  BUN 17  CREATININE 0.70  CALCIUM 9.4     CBC   Recent Labs Lab 11/13/15 1115 11/14/15 0214  HGB 13.6 12.0*  HCT 40.7 35.8*  WBC 8.1 9.8  PLT 327 275   Anti-Coagulation No results for input(s): INR in the last 168 hours.    Discharge Instructions    Call MD for:    Complete by:  As directed   Chest pain, shortness of breath, swelling of L chest   Call  MD for:  redness, tenderness, or signs of infection (pain, swelling, redness, odor or green/yellow discharge around incision site)    Complete by:  As directed   Call MD for:  severe uncontrolled pain    Complete by:  As directed   Diet general    Complete by:  As directed   Increase activity slowly    Complete by:  As directed   Lifting restrictions    Complete by:  As directed   No heavy lifting until seen in office   Other Restrictions    Complete by:  As directed   NO SMOKING Return to work AFTER seen in our office       Follow-up Information    Christinia Gully, MD .   Specialty:  Pulmonary Disease Why:  our office will call you with appointment time.  If you do not hear from then on 9/5 please call  to inquire.  Contact information: [address] [address] [phone]              Medication List    TAKE these medications   ibuprofen 800 MG tablet Commonly known as:  ADVIL,MOTRIN Take 800 mg by mouth daily as needed (stress headache).         Disposition: 01-Home or Self Care  Discharged Condition: KEYLER HOGE has met maximum benefit of inpatient care and is medically stable and cleared for discharge.  Patient is pending follow up as above.      Time spent on disposition:  Greater than 35 minutes.    Signed: [name], NP 11/15/2015  4:13 PM Pager: [phone] or [phone]  Pt seen same day (see note ) and I independently evaluated and participated in his discharge planning and arrangement for f/u in office. See progress note same date.  Christinia Gully, MD Pulmonary and [place] [phone] After 5:30 PM or weekends, call [phone]

## 2015-11-15 NOTE — Progress Notes (Signed)
To whom it may concern:    Justin Lynch has been admitted to [facility] from 9/2-9/5.  He may not return to work until after he has been seen in the office within the next week (date TBD as office closed for Labor Day).      Thank you,    [name] NP

## 2015-11-15 NOTE — Progress Notes (Signed)
Name: Justin Lynch MRN: [id number] DOB: Nov 18, 1985    ADMISSION DATE:  11/13/2015  REFERRING MD :  EDP (nanavati)   CHIEF COMPLAINT:  Spontaneous Ptx   BRIEF PATIENT DESCRIPTION: 29yowm smoker  with remote hx brain tumor (benign) s/p resection presented 9/2 to [facility] ER with sudden onset L chest and arm pain as well as SOB.  CXR revealed large spontaneous ptx.  L pigtail chest tube placed by EDP and PCCM consulted to admit.   SIGNIFICANT EVENTS  9/2 L ptx>> L pigtail placed > removed 11/15/15         SUBJECTIVE:  [name] lying quietly in bed at 30 degrees HOB nad on RA  - pain better on advil/ no narcs/ no nausea  VITAL SIGNS: Temp:  [97.6 F (36.4 C)-98.4 F (36.9 C)] 97.6 F (36.4 C) (09/04 0458) Pulse Rate:  [40-49] 40 (09/04 0458) Resp:  [17-18] 18 (09/04 0458) BP: (124-156)/(61-86) 124/61 (09/04 0458) SpO2:  [99 %] 99 % (09/04 0458)  PHYSICAL EXAMINATION: General:  Pleasant young male, NAD  Neuro:  Awake, alert, appropriate, MAE  HEENT:  Mm moist, no JVD  Cardiovascular:  s1s2 rrr, brady  Lungs:  resps even non labored on RA, clear bilat / no significant ant crepitance / no fluctuation at water seal which has risen below atmospheric  Abdomen:  Round, soft, +bs  Musculoskeletal:  Warm and dry, no edema    Recent Labs Lab 11/13/15 1115  NA 140  K 3.6  CL 107  CO2 23  BUN 17  CREATININE 0.70  GLUCOSE 108*    Recent Labs Lab 11/13/15 1115 11/14/15 0214  HGB 13.6 12.0*  HCT 40.7 35.8*  WBC 8.1 9.8  PLT 327 275   Dg Chest 2 View  Result Date: 11/15/2015 CLINICAL DATA:  Left pneumothorax EXAM: CHEST  2 VIEW COMPARISON:  11/14/2015 FINDINGS: Stable left pigtail chest tube. No definite pneumothorax is seen. Moderate subcutaneous emphysema along the left lateral chest wall, unchanged. Right lung is clear. The heart is normal in size. IMPRESSION: Stable left pigtail chest tube. No definite pneumothorax is seen. Moderate subcutaneous emphysema. Electronically  Signed   By: Julian Hy M.D.   On: 11/15/2015 08:29   X-ray Chest Pa And Lateral  Result Date: 11/14/2015 CLINICAL DATA:  Spontaneous pneumothorax, left chest tube EXAM: CHEST  2 VIEW COMPARISON:  11/13/2015 FINDINGS: Stable left pigtail chest tube. No pneumothorax is seen. Moderate subcutaneous emphysema along the left lower chest wall. Right lung is clear. The heart is normal in size. IMPRESSION: Stable left pigtail chest tube with moderate subcutaneous emphysema. No pneumothorax is seen. Electronically Signed   By: Julian Hy M.D.   On: 11/14/2015 08:13   Dg Chest 2 View  Result Date: 11/13/2015 CLINICAL DATA:  Left chest pain and shortness of breath EXAM: CHEST  2 VIEW COMPARISON:  None. Patient's prior chest x-ray is not available for comparison. FINDINGS: The mediastinal contour and cardiac silhouette are normal. There is a large left pneumothorax extending from the apex to the base. The right lung is normal. The bones are normal. IMPRESSION: Large left pneumothorax extending from the apex to the base. These results were called by telephone at the time of interpretation on 11/13/2015 at 12:13 pm to Dr. Varney Biles , who verbally acknowledged these results. Electronically Signed   By: Abelardo Diesel M.D.   On: 11/13/2015 12:16   Dg Chest Port 1 View  Result Date: 11/13/2015 CLINICAL DATA:  Follow-up pneumothorax  and chest tube placement. EXAM: PORTABLE CHEST 1 VIEW COMPARISON:  11/13/2015 FINDINGS: Mild cardiomegaly again noted. A left pleural catheter has been minimally retracted since the prior study. Slightly increasing left lateral subcutaneous emphysema identified. There is no evidence of pneumothorax. Mild left basilar atelectasis is present. IMPRESSION: Left pleural catheter has been minimally retracted since the prior study with slightly increasing left lateral subcutaneous emphysema. No evidence of pneumothorax. Mild cardiomegaly with mild left basilar atelectasis. Electronically  Signed   By: Margarette Canada M.D.   On: 11/13/2015 16:01   Dg Chest Port 1 View  Result Date: 11/13/2015 CLINICAL DATA:  Followup pneumothorax. EXAM: PORTABLE CHEST 1 VIEW COMPARISON:  Earlier film, same date. FINDINGS: There is a pigtail type drainage catheter in the left pleural space a small residual left-sided pneumothorax estimated at 10%. Left lower lobe atelectasis is noted. Small amount of subcutaneous emphysema. The right lung remains clear. IMPRESSION: Re-expansion of the left lung after placement of a pleural drainage catheter. There is a small residual pneumothorax estimated at 10%. Streaky areas of atelectasis in the left lung Electronically Signed   By: Marijo Sanes M.D.   On: 11/13/2015 14:44    ASSESSMENT / PLAN:  Spontaneous LEFT pneumothorax - No ptx on post chest tube film  Cigarette smoker   PLAN -   Chest tube to be d/c'd this am  CXR p lunch 9/4 and ok to d/c home if no recurrence  Pain control with nsaids/ d/c'd vicodine 9/3 due to nausea Smoking cessation discussed  F/u with Dr Vaughan Browner in 1 week         Christinia Gully, MD Pulmonary and [place] [phone] After 5:30 PM or weekends, call [phone]

## 2015-11-15 NOTE — Progress Notes (Signed)
Wasted 16mcg of fentanyl IV in sink. Witnessed by Bing Quarry, RN

## 2015-11-16 ENCOUNTER — Telehealth: Payer: Self-pay | Admitting: *Deleted

## 2015-11-16 NOTE — Telephone Encounter (Signed)
-----   Message from Tanda Rockers, MD sent at 11/15/2015  3:56 PM EDT ----- Ov one week with cxr for Mannam or NP f/u Pneuomthorax

## 2015-11-16 NOTE — Telephone Encounter (Signed)
Pt has appt with Brandi on 11/17/15  Spoke with pt and he is aware to keep appt

## 2015-11-17 ENCOUNTER — Encounter: Payer: Self-pay | Admitting: Pulmonary Disease

## 2015-11-17 ENCOUNTER — Ambulatory Visit (INDEPENDENT_AMBULATORY_CARE_PROVIDER_SITE_OTHER)
Admission: RE | Admit: 2015-11-17 | Discharge: 2015-11-17 | Disposition: A | Discharge status: Home / Self Care | Payer: Managed Care, Other (non HMO) | Source: Ambulatory Visit | Attending: Pulmonary Disease | Admitting: Pulmonary Disease

## 2015-11-17 ENCOUNTER — Ambulatory Visit (INDEPENDENT_AMBULATORY_CARE_PROVIDER_SITE_OTHER): Payer: Managed Care, Other (non HMO) | Admitting: Pulmonary Disease

## 2015-11-17 VITALS — BP 122/62 | HR 54 | Ht 74.0 in | Wt 208.8 lb

## 2015-11-17 DIAGNOSIS — J939 Pneumothorax, unspecified: Secondary | ICD-10-CM

## 2015-11-17 DIAGNOSIS — J9383 Other pneumothorax: Secondary | ICD-10-CM

## 2015-11-17 NOTE — Patient Instructions (Addendum)
1.  Your chest xray today is normal. 2.  If you experience new chest pain or shortness of breath report to the ER for evaluation  3.  Call if new or worsening symptoms  4.  Call the office if you have redness or drainage from the chest tube insertion site.  Keep site covered with band aide until completely healed.   5. Work notes provided.

## 2015-11-17 NOTE — Progress Notes (Signed)
[facility]   Chief Complaint  Patient presents with  . Hospitalization Follow-up    breathing is fine, pain in left side where lung collapsed. needs note to go back to work.  patient works as Biomedical scientist.     Current Outpatient Prescriptions on File Prior to Visit  Medication Sig  . ibuprofen (ADVIL,MOTRIN) 800 MG tablet Take 800 mg by mouth daily as needed (stress headache).   No current facility-administered medications on file prior to visit.      Studies: CXR 9/6 >> no acute abnormalities of lung, reduced amount of sub-Q emphysema noted  Past Medical Hx:  has a past medical history of Brain tumor (benign) ([place]).   Past Surgical hx, Allergies, Family hx, Social hx all reviewed.  Vital Signs BP 122/62 (BP Location: Left Arm, Cuff Size: Normal)   Pulse (!) 54   Ht 6\' 2"  (1.88 m)   Wt 208 lb 12.8 oz (94.7 kg)   SpO2 94%   BMI 26.81 kg/m   History of Present Illness Justin Lynch is a 30 y.o. male with a history of remote brain tumor (benign) status post resection and recent admission on 9/2 with sudden onset left chest pain and shortness of breath in the setting of spontaneous left pneumothorax who presented to the pulmonary office for hospital follow-up.  Hospital course notable for left chest tube placement. Patient reports no fevers, chills, drainage from site or chest pain.  Has noted some chest "rice crispy" at chest tube site.        Physical Exam  General - well developed adult M in no acute distress ENT - No sinus tenderness, no oral exudate, no LAN Cardiac - s1s2 regular, no murmur Chest - even/non-labored, lungs bilaterally clear. No wheeze/rales Back - No focal tenderness Abd - Soft, non-tender Ext - No edema Neuro - Normal strength Skin - No rashes Psych - normal mood, and behavior   Assessment/Plan  Left Spontaneous Pneumothorax - recent admit From 9/2-9/4 for spontaneous pneumothorax on the left requiring chest tube placement.  Plan: Now  chest x-ray evaluation > no ptx, resolving sub-q emphysema Patient educated on signs and symptoms pneumothorax and to return to ER immediately if new symptoms. Discussed no submersion until site well healed  Pt educated on fever, warmth and drainage from chest tube site to call office Work notes provided for return to work as of 9/11 without restrictions   Patient Instructions  1.  Your chest xray today is normal. 2.  If you experience new chest pain or shortness of breath report to the ER for evaluation  3.  Call if new or worsening symptoms  4.  Call the office if you have redness or drainage from the chest tube insertion site.  Keep site covered with band aide until completely healed.   5. Work notes provided.     Noe Gens, NP-C [place]  [phone] 11/17/2015, 12:16 PM

## 2015-11-17 NOTE — Progress Notes (Signed)
Chart and office note reviewed in detail along with available xrays  > agree with a/p as outlined  

## 2016-03-04 ENCOUNTER — Emergency Department (HOSPITAL_COMMUNITY)
Admission: EM | Admit: 2016-03-04 | Discharge: 2016-03-04 | Disposition: A | Discharge status: Home / Self Care | Payer: Managed Care, Other (non HMO) | Attending: Emergency Medicine | Admitting: Emergency Medicine

## 2016-03-04 ENCOUNTER — Emergency Department (HOSPITAL_COMMUNITY): Payer: Managed Care, Other (non HMO)

## 2016-03-04 ENCOUNTER — Ambulatory Visit: Payer: Managed Care, Other (non HMO)

## 2016-03-04 ENCOUNTER — Encounter (HOSPITAL_COMMUNITY): Payer: Self-pay

## 2016-03-04 DIAGNOSIS — Z79899 Other long term (current) drug therapy: Secondary | ICD-10-CM | POA: Insufficient documentation

## 2016-03-04 DIAGNOSIS — Y999 Unspecified external cause status: Secondary | ICD-10-CM | POA: Insufficient documentation

## 2016-03-04 DIAGNOSIS — Y929 Unspecified place or not applicable: Secondary | ICD-10-CM | POA: Insufficient documentation

## 2016-03-04 DIAGNOSIS — Z87891 Personal history of nicotine dependence: Secondary | ICD-10-CM | POA: Insufficient documentation

## 2016-03-04 DIAGNOSIS — M25561 Pain in right knee: Secondary | ICD-10-CM | POA: Insufficient documentation

## 2016-03-04 DIAGNOSIS — X501XXA Overexertion from prolonged static or awkward postures, initial encounter: Secondary | ICD-10-CM | POA: Insufficient documentation

## 2016-03-04 DIAGNOSIS — Y9301 Activity, walking, marching and hiking: Secondary | ICD-10-CM | POA: Insufficient documentation

## 2016-03-04 MED ORDER — NAPROXEN 500 MG PO TABS: 500.0000 mg | ORAL_TABLET | Freq: Two times a day (BID) | ORAL | 0 refills | Status: DC | PRN

## 2016-03-04 NOTE — Discharge Instructions (Signed)
Wear knee sleeve for stabilization of knee and swelling. Use crutches as needed for comfort. Ice and elevate knee throughout the day. Naproxen for swelling and mild to moderate pain. Call the orthopedist listed to schedule follow up appointment for recheck of ongoing knee pain in one to two weeks. That appointment can be canceled with a 24-48 hour notice if complete resolution of pain.  COLD THERAPY DIRECTIONS:  Ice or gel packs can be used to reduce both pain and swelling. Ice is the most helpful within the first 24 to 48 hours after an injury or flareup from overusing a muscle or joint.  Ice is effective, has very few side effects, and is safe for most people to use.   If you expose your skin to cold temperatures for too long or without the proper protection, you can damage your skin or nerves. Watch for signs of skin damage due to cold.   HOME CARE INSTRUCTIONS  Follow these tips to use ice and cold packs safely.  Place a dry or damp towel between the ice and skin. A damp towel will cool the skin more quickly, so you may need to shorten the time that the ice is used.  For a more rapid response, add gentle compression to the ice.  Ice for no more than 10 to 20 minutes at a time. The bonier the area you are icing, the less time it will take to get the benefits of ice.  Check your skin after 5 minutes to make sure there are no signs of a poor response to cold or skin damage.  Rest 20 minutes or more in between uses.  Once your skin is numb, you can end your treatment. You can test numbness by very lightly touching your skin. The touch should be so light that you do not see the skin dimple from the pressure of your fingertip. When using ice, most people will feel these normal sensations in this order: cold, burning, aching, and numbness.

## 2016-03-04 NOTE — ED Provider Notes (Signed)
[facility] DEPT Provider Note   CSN: [id number] Arrival date & time: 03/04/16  [id number]     History   Chief Complaint Chief Complaint  Patient presents with  . Knee Pain    HPI Justin Lynch is a 30 y.o. male.  The history is provided by the patient and medical records. No language interpreter was used.   Justin Lynch is a 30 y.o. male  who presents to the Emergency Department complaining of Acute onset of right knee pain which occurred yesterday. Patient states he was walking his dog when the dog lunged forward, causing the patient to stepped in a pothole with his right lower extremity. He felt a pop to the lateral aspect of his knee, but was able to continue walking the dog. 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 Afterward his knee continued to worsen and was swollen. He elevated the knee and applied ice, but only for 3-4 minutes. He noticed minimal relief. He also tried Aleve which did provide a small amount of pain relief. No history of injuries to the right lower extremity. Denies catching or locking. No numbness, tingling, weakness.   Past Medical History:  Diagnosis Date  . Brain tumor (benign) [facility])     Patient Active Problem List   Diagnosis Date Noted  . Pneumothorax on left   . Spontaneous pneumothorax 11/13/2015    Past Surgical History:  Procedure Laterality Date  . BRAIN SURGERY    . brain tumor removal         Home Medications    Prior to Admission medications   Medication Sig Start Date End Date Taking? Authorizing Provider  ibuprofen (ADVIL,MOTRIN) 800 MG tablet Take 800 mg by mouth daily as needed (stress headache).    Historical Provider, MD  naproxen (NAPROSYN) 500 MG tablet Take 1 tablet (500 mg total) by mouth 2 (two) times daily as needed. 03/04/16   [place], PA-C    Family History History reviewed. No pertinent family history.  Social History Social History    Substance Use Topics  . Smoking status: Former Smoker    Packs/day: 0.25    Years: 8.00    Types: Cigarettes    Quit date: 11/12/2015  . Smokeless tobacco: Never Used  . Alcohol use No     Allergies   Powder   Review of Systems Review of Systems  Musculoskeletal: Positive for arthralgias and joint swelling.  Neurological: Negative for weakness and numbness.     Physical Exam Updated Vital Signs BP 111/88 (BP Location: Left Arm)   Pulse (!) 56   Temp 97.4 F (36.3 C) (Oral)   Resp 16   SpO2 99%   Physical Exam  Constitutional: He is oriented to person, place, and time. He appears well-developed and well-nourished. No distress.  HENT:  Head: Normocephalic and atraumatic.  Cardiovascular: Normal rate, regular rhythm and normal heart sounds.   No murmur heard. Pulmonary/Chest: Effort normal and breath sounds normal. No respiratory distress.  Abdominal: Soft. He exhibits no distension. There is no tenderness.  Musculoskeletal:  Right knee: No gross deformity noted. Mild swelling. Knee with full ROM. TTP of right lateral joint line. No abnormal alignment or patellar mobility. No bruising, erythema, or warmth overlaying the joint. No varus/valgus laxity. Negative drawer's, negative Lachman's, crepitus with McMurray's. 2+ DP pulses bilaterally. All compartments are soft. Sensation intact distal to injury.  Neurological: He is alert  and oriented to person, place, and time.  Skin: Skin is warm and dry.  Nursing note and vitals reviewed.    ED Treatments / Results  Labs (all labs ordered are listed, but only abnormal results are displayed) Labs Reviewed - No data to display  EKG  EKG Interpretation None       Radiology Dg Knee Complete 4 Views Right  Result Date: 03/04/2016 CLINICAL DATA:  [place] last night with pain along the lateral and anterior aspect of the right knee. EXAM: RIGHT KNEE - COMPLETE 4+ VIEW COMPARISON:  None. FINDINGS: Negative for a fracture or  dislocation. Difficult to exclude a suprapatellar joint effusion. Alignment of the right knee is normal. IMPRESSION: No acute bone abnormality to the right knee. Electronically Signed   By: Markus Daft M.D.   On: 03/04/2016 09:15    Procedures Procedures (including critical care time)  Medications Ordered in ED Medications - No data to display   Initial Impression / Assessment and Plan / ED Course  I have reviewed the triage vital signs and the nursing notes.  Pertinent labs & imaging results that were available during my care of the patient were reviewed by me and considered in my medical decision making (see chart for details).  Clinical Course    Justin Lynch is a 30 y.o. male who presents to ED for right knee pain s/p injury yesterday. On exam, patient has lateral joint line tenderness and crepitus with McMurray's. Ligaments appear intact. RLE NVI. Knee sleeve and crutches given in ED. Rx for naproxen. Ortho follow up given. Return precautions and home care instructions discussed. All questions answered.   Final Clinical Impressions(s) / ED Diagnoses   Final diagnoses:  Acute pain of right knee    New Prescriptions Discharge Medication List as of 03/04/2016  9:55 AM    START taking these medications   Details  naproxen (NAPROSYN) 500 MG tablet Take 1 tablet (500 mg total) by mouth 2 (two) times daily as needed., Starting Sat 03/04/2016, Print         [employer] Ward, PA-C 03/04/16 1125    Lacretia Leigh, MD 03/05/16 1128

## 2016-03-04 NOTE — ED Triage Notes (Signed)
Pt c/o R knee pain x 1 day.  Pain score 7/10.  Pt reports that he was walking his dog, stepped in a pothole, and "felt a pop."  Sts swelling and throbbing."  Pt was able to ambulate to room.

## 2016-04-27 ENCOUNTER — Encounter (HOSPITAL_COMMUNITY): Payer: Self-pay

## 2016-04-27 ENCOUNTER — Emergency Department (HOSPITAL_COMMUNITY)
Admission: EM | Admit: 2016-04-27 | Discharge: 2016-04-27 | Disposition: A | Discharge status: Home / Self Care | Payer: Managed Care, Other (non HMO) | Attending: Emergency Medicine | Admitting: Emergency Medicine

## 2016-04-27 DIAGNOSIS — K047 Periapical abscess without sinus: Secondary | ICD-10-CM | POA: Insufficient documentation

## 2016-04-27 DIAGNOSIS — Z87891 Personal history of nicotine dependence: Secondary | ICD-10-CM | POA: Insufficient documentation

## 2016-04-27 MED ORDER — AMOXICILLIN 500 MG PO CAPS: 500.0000 mg | ORAL_CAPSULE | Freq: Three times a day (TID) | ORAL | 0 refills | Status: DC

## 2016-04-27 MED ORDER — AMOXICILLIN 500 MG PO CAPS
500.0000 mg | ORAL_CAPSULE | Freq: Once | ORAL | Status: AC
  Administered 2016-04-27: 500 mg via ORAL
  Filled 2016-04-27: qty 1

## 2016-04-27 MED ORDER — ACETAMINOPHEN 500 MG PO TABS
1000.0000 mg | ORAL_TABLET | Freq: Once | ORAL | Status: AC
  Administered 2016-04-27: 1000 mg via ORAL
  Filled 2016-04-27: qty 2

## 2016-04-27 NOTE — ED Triage Notes (Signed)
Pt complaining of L lower facial swelling. Pt states "bad tooth." Pt states drainage from gum x 3 days. Pt with increased pain and swelling today. Pt with no SOB. Pt NAD.

## 2016-04-27 NOTE — ED Provider Notes (Signed)
[facility] DEPT Provider Note   CSN: [id number] Arrival date & time: 04/27/16  0131   Time seen 05:37 AM  History   Chief Complaint Chief Complaint  Patient presents with  . Dental Pain  . Facial Swelling    HPI Justin Lynch is a 31 y.o. male.  HPI  patient states he thought some dental tools at this drugstore and he was scraping something that was around his teeth of the gums about 5 days ago. He states the past 2 days he started getting some pain in his left jaw and started having some mild swelling which she states has gotten worse. He denies any fever or chills. He states it to fit in the area is "broken". He states it hurts when he chews. The gums have not been draining yet. He states he feels like there is pressure around that tooth. It makes it hard to chew. He denies significant difficulty swallowing or breathing. Patient does not have a dentist. He has been taking Aleve for pain.  PCP none  Past Medical History:  Diagnosis Date  . Brain tumor (benign) [facility])     Patient Active Problem List   Diagnosis Date Noted  . Pneumothorax on left   . Spontaneous pneumothorax 11/13/2015    Past Surgical History:  Procedure Laterality Date  . BRAIN SURGERY    . brain tumor removal         Home Medications    Prior to Admission medications   Medication Sig Start Date End Date Taking? Authorizing Provider  amoxicillin (AMOXIL) 500 MG capsule Take 1 capsule (500 mg total) by mouth 3 (three) times daily. 04/27/16   Rolland Porter, MD  ibuprofen (ADVIL,MOTRIN) 800 MG tablet Take 800 mg by mouth daily as needed (stress headache).    Historical Provider, MD  naproxen (NAPROSYN) 500 MG tablet Take 1 tablet (500 mg total) by mouth 2 (two) times daily as needed. 03/04/16   [place], PA-C    Family History History reviewed. No pertinent family history.  Social History Social History  Substance Use Topics  . Smoking status: Former Smoker    Packs/day: 0.25   Years: 8.00    Types: Cigarettes    Quit date: 11/12/2015  . Smokeless tobacco: Never Used  . 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    Physical Exam Updated Vital Signs BP 137/70   Pulse (!) 55   Temp 97.7 F (36.5 C) (Oral)   Resp 18   SpO2 98%   Vital signs normal except for bradycardia   Physical Exam  Constitutional: He is oriented to person, place, and time. He appears well-developed and well-nourished.  HENT:  Head: Normocephalic and atraumatic.  Right Ear: External ear normal.  Left Ear: External ear normal.  Mouth/Throat: Uvula is midline, oropharynx is clear and moist and mucous membranes are normal. Dental abscesses and dental caries present.    Eyes: Conjunctivae and EOM are normal.  Neck: Normal range of motion.  Cardiovascular: Bradycardia present.   Pulmonary/Chest: Effort normal. No respiratory distress.  Musculoskeletal: Normal range of motion.  Neurological: He is alert and oriented to person, place, and time. No cranial nerve deficit.  Skin: Skin is warm and dry. No rash noted.  Psychiatric: He has a normal mood and affect. His behavior is normal. Thought content normal.  ED Treatments / Results   Procedures Procedures (including critical care time)  Medications Ordered in ED Medications  amoxicillin (AMOXIL) capsule 500 mg (not administered)  acetaminophen (TYLENOL) tablet 1,000 mg (not administered)     Initial Impression / Assessment and Plan / ED Course  I have reviewed the triage vital signs and the nursing notes.  Pertinent labs & imaging results that were available during my care of the patient were reviewed by me and considered in my medical decision making (see chart for details).  Patient was given choice of IV antibiotics plus a prescription go home or starting on oral antibiotics. He chose oral antibiotics. He  states amoxicillin has worked in the past. Also amoxicillin's $4 at [employer]. He can take acetaminophen and ibuprofen for pain, he can gargle with warm salt water. He should return if he gets a high fever or the facial swelling gets worse. Discussed follow up with a dentist about getting that tooth removed.  Final Clinical Impressions(s) / ED Diagnoses   Final diagnoses:  Dental abscess    New Prescriptions New Prescriptions   AMOXICILLIN (AMOXIL) 500 MG CAPSULE    Take 1 capsule (500 mg total) by mouth 3 (three) times daily.  OTC acetaminophen/motrin  Plan discharge  Rolland Porter, MD, Barbette Or, MD 04/27/16 [phone]

## 2016-04-27 NOTE — ED Notes (Signed)
Pt departed in NAD, refused use of wheelchair.  

## 2016-04-27 NOTE — Discharge Instructions (Signed)
Take the antibiotic until gone. Gargle with warm salt water. The gum around that tooth may start to drain pus. Take acetaminophen 1000 mg + motrin 600 mg 4 times a day (do not take the motrin with aleve, if you take aleve take 2 tabs twice a day for pain).  Recheck if you get a fever, the facial swelling gets worse, you have difficulty breathing or swallowing. You need to see a dentist about removing that tooth once the infection is gone.

## 2016-05-26 ENCOUNTER — Encounter (HOSPITAL_COMMUNITY): Payer: Self-pay

## 2016-05-26 ENCOUNTER — Emergency Department (HOSPITAL_COMMUNITY)
Admission: EM | Admit: 2016-05-26 | Discharge: 2016-05-26 | Disposition: A | Discharge status: Home / Self Care | Payer: Self-pay | Attending: Emergency Medicine | Admitting: Emergency Medicine

## 2016-05-26 ENCOUNTER — Emergency Department (HOSPITAL_COMMUNITY): Payer: Self-pay

## 2016-05-26 DIAGNOSIS — Y999 Unspecified external cause status: Secondary | ICD-10-CM | POA: Insufficient documentation

## 2016-05-26 DIAGNOSIS — Y929 Unspecified place or not applicable: Secondary | ICD-10-CM | POA: Insufficient documentation

## 2016-05-26 DIAGNOSIS — Y9389 Activity, other specified: Secondary | ICD-10-CM | POA: Insufficient documentation

## 2016-05-26 DIAGNOSIS — X500XXA Overexertion from strenuous movement or load, initial encounter: Secondary | ICD-10-CM | POA: Insufficient documentation

## 2016-05-26 DIAGNOSIS — Z87891 Personal history of nicotine dependence: Secondary | ICD-10-CM | POA: Insufficient documentation

## 2016-05-26 DIAGNOSIS — M545 Low back pain, unspecified: Secondary | ICD-10-CM

## 2016-05-26 MED ORDER — NAPROXEN 375 MG PO TABS: 375.0000 mg | ORAL_TABLET | Freq: Two times a day (BID) | ORAL | 0 refills | Status: DC

## 2016-05-26 MED ORDER — CYCLOBENZAPRINE HCL 10 MG PO TABS: 10.0000 mg | ORAL_TABLET | Freq: Two times a day (BID) | ORAL | 0 refills | Status: DC | PRN

## 2016-05-26 MED ORDER — KETOROLAC TROMETHAMINE 15 MG/ML IJ SOLN
30.0000 mg | Freq: Once | INTRAMUSCULAR | Status: DC
  Filled 2016-05-26: qty 2

## 2016-05-26 NOTE — ED Notes (Signed)
Pt states he understands instructions. Home stable with steady gait. 

## 2016-05-26 NOTE — ED Triage Notes (Signed)
Pt presents for evaluation of lower back pain starting yesterday when bending over to pick up child. Pt denies numbness/tingling in triage, pt is ambulatory. States lower back feels sore, reports no hx of same.

## 2016-05-26 NOTE — ED Provider Notes (Signed)
[facility] DEPT Provider Note   CSN: [id number] Arrival date & time: 05/26/16  0825   By signing my name below, I, Neta Mends, attest that this documentation has been prepared under the direction and in the presence of Melina Schools, PA-C. Electronically Signed: Neta Mends, ED Scribe. 05/26/2016. 9:19 AM.   History   Chief Complaint Chief Complaint  Patient presents with  . Back Pain    The history is provided by the patient. No language interpreter was used.   HPI Comments:  Justin Lynch is a 31 y.o. male who presents to the Emergency Department complaining of constant back pain since yesterday. Pt states that yesterday he was playing his his daughter and picked her up to throw her in the air, and when he was lifting her up he felt a sudden pain in his lower back. Pt has been ambulatory since the incident but with mild pain. Pt used a heating pad and taken Aleve with minimal relief. 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 Pt denies Lower extremity numbness, urinary symptoms, saddle anesthesia, bladder/incontinence, urinary retention, hx of IV drug use, hx of cancer. Patient reports that back usually run in his family. Denies any history of same.     Past Medical History:  Diagnosis Date  . Brain tumor (benign) [facility])     Patient Active Problem List   Diagnosis Date Noted  . Pneumothorax on left   . Spontaneous pneumothorax 11/13/2015    Past Surgical History:  Procedure Laterality Date  . BRAIN SURGERY    . brain tumor removal         Home Medications    Prior to Admission medications   Medication Sig Start Date End Date Taking? Authorizing Provider  amoxicillin (AMOXIL) 500 MG capsule Take 1 capsule (500 mg total) by mouth 3 (three) times daily. 04/27/16   Rolland Porter, MD  ibuprofen (ADVIL,MOTRIN) 800 MG tablet Take 800 mg by mouth daily as needed (stress headache).    Historical  Provider, MD  naproxen (NAPROSYN) 500 MG tablet Take 1 tablet (500 mg total) by mouth 2 (two) times daily as needed. 03/04/16   [place], PA-C    Family History No family history on file.  Social History Social History  Substance Use Topics  . Smoking status: Former Smoker    Packs/day: 0.25    Years: 8.00    Types: Cigarettes    Quit date: 11/12/2015  . Smokeless tobacco: Never Used  . Alcohol use No     Allergies   Powder   Review of Systems Review of Systems  Constitutional: Negative for chills and fever.  Genitourinary: Negative for dysuria.  Musculoskeletal: Positive for back pain. Negative for gait problem.  Skin: Negative for wound.  Neurological: Negative for weakness and numbness.     Physical Exam Updated Vital Signs BP 132/76 (BP Location: Left Arm)   Pulse 79   Temp 97.7 F (36.5 C) (Oral)   Resp 16   Ht 6\' 2"  (1.88 m)   Wt 225 lb (102.1 kg)   SpO2 99%   BMI 28.89 kg/m   Physical Exam  Constitutional: He is oriented to person, place, and time. He appears well-developed and well-nourished. No distress.  Ambulatory to room.  HENT:  Head: Normocephalic and atraumatic.  Eyes: Conjunctivae are normal.  Neck: Normal range of motion. Neck supple.  Cardiovascular: Normal rate.   Pulmonary/Chest: Effort normal.  Abdominal: He exhibits no distension.  Musculoskeletal: Normal range of motion.  Midline L spine tenderness and bilateral paraspinal tenderness. No midline T spine tenderness. No deformity or stepoffs. Sensation intact to sharp/dull. Strength 5/5 in le. Dp pulses are 2+. Cap refill normal. FUll ROM. Negative straight leg raise test.  Neurological: He is alert and oriented to person, place, and time.  Skin: Skin is warm and dry. Capillary refill takes less than 2 seconds.  Psychiatric: He has a normal mood and affect.  Nursing note and vitals reviewed.    ED Treatments / Results  DIAGNOSTIC STUDIES:  Oxygen Saturation is 99% on  RA, normal by my interpretation.    COORDINATION OF CARE:  9:16 AM Will prescribe muscle relaxer and refer to orthopaedics. Suggested to patient use epsom salts in warm baths. Discussed treatment plan with pt at bedside and pt agreed to plan.   Labs (all labs ordered are listed, but only abnormal results are displayed) Labs Reviewed - No data to display  EKG  EKG Interpretation None       Radiology Dg Lumbar Spine Complete  Result Date: 05/26/2016 CLINICAL DATA:  Low back pain, no known injury, initial encounter EXAM: LUMBAR SPINE - COMPLETE 4+ VIEW COMPARISON:  None. FINDINGS: There is no evidence of lumbar spine fracture. Alignment is normal. Intervertebral disc spaces are maintained. IMPRESSION: No acute abnormality noted. Electronically Signed   By: Inez Catalina M.D.   On: 05/26/2016 09:06    Procedures Procedures (including critical care time)  Medications Ordered in ED Medications  ketorolac (TORADOL) 15 MG/ML injection 30 mg (not administered)     Initial Impression / Assessment and Plan / ED Course  I have reviewed the triage vital signs and the nursing notes.  Pertinent labs & imaging results that were available during my care of the patient were reviewed by me and considered in my medical decision making (see chart for details).     Patient with back pain. Likely a pulled muscle from lifting his daughter yesterday. No neurological deficits and normal neuro exam.  Patient can walk but states is painful.  No loss of bowel or bladder control.  No concern for cauda equina.  No fever, night sweats, weight loss, h/o cancer, IVDU. Lumbar x-ray that any acute abnormalities. RICE protocol and pain medicine indicated and discussed with patient. Patient given referral to orthopedics. I give her strict return precautions. All questions answered prior to discharge. Able to ambulate upon discharge and normal gait.    Final Clinical Impressions(s) / ED Diagnoses   Final  diagnoses:  Acute midline low back pain without sciatica    New Prescriptions New Prescriptions   CYCLOBENZAPRINE (FLEXERIL) 10 MG TABLET    Take 1 tablet (10 mg total) by mouth 2 (two) times daily as needed for muscle spasms.   NAPROXEN (NAPROSYN) 375 MG TABLET    Take 1 tablet (375 mg total) by mouth 2 (two) times daily.  I personally performed the services described in this documentation, which was scribed in my presence. The recorded information has been reviewed and is accurate.      Doristine Devoid, PA-C 05/26/16 3546    Malvin Johns, MD 05/26/16 [phone]

## 2016-05-26 NOTE — ED Notes (Signed)
ED Provider at bedside. 

## 2016-05-26 NOTE — Discharge Instructions (Signed)
Your x-ray was normal. We'll try naproxen for pain. Do not take any extra Aleve/ibuprofen/Advil/Motrin with this medication. May take Tylenol. Have continue Flexeril as a muscle relaxer and will make you drowsy. Please follow-up with your primary care doctor if symptoms not improved. Have given orthopedic referral. Please return to the ED for worsening symptoms.   SEEK IMMEDIATE MEDICAL ATTENTION IF: New numbness, tingling, weakness, or problem with the use of your arms or legs.  Severe back pain not relieved with medications.  Change in bowel or bladder control.  Urinary retention.  Numbness in your groin.  Increasing pain in any areas of the body (such as chest or abdominal pain).  Shortness of breath, dizziness or fainting.  Nausea (feeling sick to your stomach), vomiting, fever, or sweats.

## 2016-05-26 NOTE — ED Notes (Signed)
Pt c/o low back pain after lifting 3 yesr old daughter.

## 2016-11-09 ENCOUNTER — Ambulatory Visit (HOSPITAL_COMMUNITY)
Admission: EM | Admit: 2016-11-09 | Discharge: 2016-11-09 | Disposition: A | Discharge status: Home / Self Care | Payer: 59 | Attending: Emergency Medicine | Admitting: Emergency Medicine

## 2016-11-09 ENCOUNTER — Encounter (HOSPITAL_COMMUNITY): Payer: Self-pay | Admitting: *Deleted

## 2016-11-09 DIAGNOSIS — M778 Other enthesopathies, not elsewhere classified: Secondary | ICD-10-CM | POA: Diagnosis not present

## 2016-11-09 DIAGNOSIS — G5603 Carpal tunnel syndrome, bilateral upper limbs: Secondary | ICD-10-CM

## 2016-11-09 DIAGNOSIS — M25531 Pain in right wrist: Secondary | ICD-10-CM

## 2016-11-09 DIAGNOSIS — M25532 Pain in left wrist: Secondary | ICD-10-CM

## 2016-11-09 DIAGNOSIS — M779 Enthesopathy, unspecified: Secondary | ICD-10-CM

## 2016-11-09 MED ORDER — MELOXICAM 15 MG PO TABS: 15.0000 mg | ORAL_TABLET | Freq: Every day | ORAL | 0 refills | Status: DC

## 2016-11-09 NOTE — Discharge Instructions (Signed)
Cold compresses to your wrist and hands. Wear your wrist braces every day for the next several days and especially at work. Take him off periodically for movement of the hands and fingers. Take the medication as directed. Read your instructions.

## 2016-11-09 NOTE — ED Provider Notes (Signed)
[place]    CSN: [id number] Arrival date & time: 11/09/16  1103     History   Chief Complaint Chief Complaint  Patient presents with  . Hand Pain    HPI Justin Lynch is a 31 y.o. male.   31 year old male presents with complaints of hand pain. States he has some stiffness, weakness and pain in his fingers his hands and wrists. Pain radiates from his wrist to his forearms. Turns out his job exacerbates the pain and includes much grasping, lifting, pulling and excessive use of his hands and wrist over many hours during the day. Denies trauma, fall or other known injury.      Past Medical History:  Diagnosis Date  . Brain tumor (benign) [facility])     Patient Active Problem List   Diagnosis Date Noted  . Pneumothorax on left   . Spontaneous pneumothorax 11/13/2015    Past Surgical History:  Procedure Laterality Date  . BRAIN SURGERY    . brain tumor removal         Home Medications    Prior to Admission medications   Medication Sig Start Date End Date Taking? Authorizing Provider  amoxicillin (AMOXIL) 500 MG capsule Take 1 capsule (500 mg total) by mouth 3 (three) times daily. 04/27/16   Rolland Porter, MD  cyclobenzaprine (FLEXERIL) 10 MG tablet Take 1 tablet (10 mg total) by mouth 2 (two) times daily as needed for muscle spasms. 05/26/16   Doristine Devoid, PA-C  meloxicam (MOBIC) 15 MG tablet Take 1 tablet (15 mg total) by mouth daily. 11/09/16   Janne Napoleon, NP    Family History History reviewed. No pertinent family history.  Social History Social History  Substance Use Topics  . Smoking status: Former Smoker    Packs/day: 0.25    Years: 8.00    Types: Cigarettes    Quit date: 11/12/2015  . Smokeless tobacco: Never Used  . Alcohol use No     Allergies   Powder   Review of Systems Review of Systems  Constitutional: Negative.   Respiratory: Negative.   Gastrointestinal: Negative.   Genitourinary: Negative.   Musculoskeletal:   As per HPI  Skin: Negative.   Neurological: Negative for dizziness, weakness, numbness and headaches.  All other systems reviewed and are negative.    Physical Exam Triage Vital Signs ED Triage Vitals  Enc Vitals Group     BP 11/09/16 1238 132/70     Pulse Rate 11/09/16 1238 82     Resp 11/09/16 1238 18     Temp 11/09/16 1238 98.6 F (37 C)     Temp Source 11/09/16 1238 Oral     SpO2 11/09/16 1238 98 %     Weight --      Height --      Head Circumference --      Peak Flow --      Pain Score 11/09/16 1237 5     Pain Loc --      Pain Edu? --      Excl. in [place]? --    No data found.   Updated Vital Signs BP 132/70 (BP Location: Right Arm)   Pulse 82   Temp 98.6 F (37 C) (Oral)   Resp 18   SpO2 98%   Visual Acuity Right Eye Distance:   Left Eye Distance:   Bilateral Distance:    Right Eye Near:   Left Eye Near:    Bilateral Near:  Physical Exam  Constitutional: He is oriented to person, place, and time. He appears well-developed and well-nourished.  HENT:  Head: Normocephalic and atraumatic.  Eyes: EOM are normal.  Neck: Normal range of motion. Neck supple.  Pulmonary/Chest: Effort normal.  Musculoskeletal:  Weakness with the grip bilaterally, weakness with extension of the wrist left greater than the right. Positive bilateral Finkelstein signs. Tenderness along the tendons of the forearm. Positive Tinel sign. Distal vascular and sensory and neuro is intact. Motor as above.  Neurological: He is alert and oriented to person, place, and time. No cranial nerve deficit.  Skin: Skin is warm and dry.  Psychiatric: He has a normal mood and affect.  Nursing note and vitals reviewed.    UC Treatments / Results  Labs (all labs ordered are listed, but only abnormal results are displayed) Labs Reviewed - No data to display  EKG  EKG Interpretation None       Radiology No results found.  Procedures Procedures (including critical care  time)  Medications Ordered in UC Medications - No data to display   Initial Impression / Assessment and Plan / UC Course  I have reviewed the triage vital signs and the nursing notes.  Pertinent labs & imaging results that were available during my care of the patient were reviewed by me and considered in my medical decision making (see chart for details).     Cold compresses to your wrist and hands. Wear your wrist braces every day for the next several days and especially at work. Take him off periodically for movement of the hands and fingers. Take the medication as directed. Read your instructions.   Final Clinical Impressions(s) / UC Diagnoses   Final diagnoses:  Pain in both wrists  Tendinitis of both hands  Carpal tunnel syndrome on both sides    New Prescriptions New Prescriptions   MELOXICAM (MOBIC) 15 MG TABLET    Take 1 tablet (15 mg total) by mouth daily.     Controlled Substance Prescriptions [place] Controlled Substance Registry consulted? Not Applicable   Janne Napoleon, NP 11/09/16 1317

## 2016-11-09 NOTE — ED Triage Notes (Signed)
Pt  Reports   His   Hands     Have  Been  Bothering  Him   For   sev  Years    Pt  Reports   Hands    Got  Weak   With  Pain in  Hands   And  Forearms   Noticed   This  Am         Pt is   Able  To     Make  A  Fist     Denies   Any  Recent  Injury

## 2017-02-23 ENCOUNTER — Other Ambulatory Visit: Payer: Self-pay

## 2017-02-23 ENCOUNTER — Encounter (HOSPITAL_COMMUNITY): Payer: Self-pay | Admitting: Emergency Medicine

## 2017-02-23 ENCOUNTER — Ambulatory Visit (HOSPITAL_COMMUNITY)
Admission: EM | Admit: 2017-02-23 | Discharge: 2017-02-23 | Disposition: A | Discharge status: Home / Self Care | Payer: 59

## 2017-02-23 ENCOUNTER — Emergency Department (HOSPITAL_COMMUNITY)
Admission: EM | Admit: 2017-02-23 | Discharge: 2017-02-23 | Disposition: A | Discharge status: Home / Self Care | Payer: Self-pay | Attending: Emergency Medicine | Admitting: Emergency Medicine

## 2017-02-23 DIAGNOSIS — Z5321 Procedure and treatment not carried out due to patient leaving prior to being seen by health care provider: Secondary | ICD-10-CM | POA: Insufficient documentation

## 2017-02-23 DIAGNOSIS — S0990XA Unspecified injury of head, initial encounter: Secondary | ICD-10-CM

## 2017-02-23 DIAGNOSIS — R51 Headache: Secondary | ICD-10-CM | POA: Insufficient documentation

## 2017-02-23 NOTE — ED Triage Notes (Signed)
PT C/O: head inj .Marland Kitchen.. sts he was cleaning house and some metal cleaning supplies fell and hit back of his head.... sts vision was black/blurred for a few seconds  ONSET: 1130 today   SX ALSO INCLUDE: HA, tired  DENIES: does not know if he lost conscious   TAKING MEDS: Aleve w/no relief.   A&O x4... NAD... Ambulatory

## 2017-02-23 NOTE — Discharge Instructions (Signed)
Given your history and amount of pain, suggest f/u in the ED for a CT scan for completeness. Sorry for the wait and best of luck to you.

## 2017-02-23 NOTE — ED Triage Notes (Signed)
Pt arrives from [facility] c/o headache and "fuzzy thinking" following head injury around 1130 today.  Pt reports 2 heavy boxes fell from shelf, struck pt on back of head. Pt reports vision "blacked out" , denies LOC. Pt reports hx brain surgery as child.  L pupil 1, R pupil 2. Pt reports taking 1 Aleve after incident.

## 2017-02-23 NOTE — ED Notes (Signed)
No response when called for vitals.  

## 2017-02-23 NOTE — ED Provider Notes (Signed)
[place]    CSN: [id number] Arrival date & time: 02/23/17  1206     History   Chief Complaint Chief Complaint  Patient presents with  . Head Injury    HPI Justin Lynch is a 31 y.o. male.    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 He was cleaning his home a box fell on his head. He dose report "blacking out", but then says he did not lose consciences. His head pain has progressed over the last few hours and is now an 8 out of 10. He has no dizziness, N or V, but feels fatigued and worried about his "pulsating pain". No changes in vision is noted.       Past Medical History:  Diagnosis Date  . Brain tumor (benign) [facility])     Patient Active Problem List   Diagnosis Date Noted  . Pneumothorax on left   . Spontaneous pneumothorax 11/13/2015    Past Surgical History:  Procedure Laterality Date  . BRAIN SURGERY    . brain tumor removal         Home Medications    Prior to Admission medications   Medication Sig Start Date End Date Taking? Authorizing Provider  amoxicillin (AMOXIL) 500 MG capsule Take 1 capsule (500 mg total) by mouth 3 (three) times daily. 04/27/16   Rolland Porter, MD  cyclobenzaprine (FLEXERIL) 10 MG tablet Take 1 tablet (10 mg total) by mouth 2 (two) times daily as needed for muscle spasms. 05/26/16   Doristine Devoid, PA-C  meloxicam (MOBIC) 15 MG tablet Take 1 tablet (15 mg total) by mouth daily. 11/09/16   Janne Napoleon, NP    Family History History reviewed. No pertinent family history.  Social History Social History   Tobacco Use  . Smoking status: Former Smoker    Packs/day: 0.25    Years: 8.00    Pack years: 2.00    Types: Cigarettes    Last attempt to quit: 11/12/2015    Years since quitting: 1.2  . Smokeless tobacco: Never Used  Substance Use Topics  . Alcohol use: No  . Drug use: Yes    Types: Marijuana    Comment: daily     Allergies    Powder   Review of Systems Review of Systems  Eyes: Negative for photophobia and visual disturbance.  Neurological: Negative for dizziness, speech difficulty, weakness and light-headedness.  Psychiatric/Behavioral: Negative for confusion.     Physical Exam Triage Vital Signs ED Triage Vitals  Enc Vitals Group     BP 02/23/17 1248 126/62     Pulse Rate 02/23/17 1248 60     Resp 02/23/17 1248 16     Temp 02/23/17 1248 97.9 F (36.6 C)     Temp Source 02/23/17 1248 Oral     SpO2 02/23/17 1248 100 %     Weight --      Height --      Head Circumference --      Peak Flow --      Pain Score 02/23/17 1249 8     Pain Loc --      Pain Edu? --      Excl. in [place]? --    No data found.  Updated Vital Signs BP 126/62 (BP Location: Left Arm)   Pulse 60   Temp 97.9 F (36.6 C) (Oral)   Resp 16   SpO2 100%  Visual Acuity Right Eye Distance:   Left Eye Distance:   Bilateral Distance:    Right Eye Near:   Left Eye Near:    Bilateral Near:     Physical Exam  Constitutional: He appears well-developed and well-nourished. No distress.  HENT:  Head: Normocephalic and atraumatic.  Surgical scar is noted and well healed, mild indention to right upper parietal lobe, patient notes to be chronic, no swelling or ecchymosis is noted  Eyes: EOM are normal. Pupils are equal, round, and reactive to light.  Neck: Normal range of motion.  Neurological: He is alert. No cranial nerve deficit.  Skin: Skin is warm and dry. He is not diaphoretic.  Psychiatric: His behavior is normal.  Nursing note and vitals reviewed.    UC Treatments / Results  Labs (all labs ordered are listed, but only abnormal results are displayed) Labs Reviewed - No data to display  EKG  EKG Interpretation None       Radiology No results found.  Procedures Procedures (including critical care time)  Medications Ordered in UC Medications - No data to display   Initial Impression / Assessment and Plan  / UC Course  I have reviewed the triage vital signs and the nursing notes.  Pertinent labs & imaging results that were available during my care of the patient were reviewed by me and considered in my medical decision making (see chart for details).     Head injury in a patient with prior brain tumor and fontanelle as a child, pain is 8 out of 10 with normal neurologic exam. Send to ED for CT scan based on prior history and amount of pain.  Final Clinical Impressions(s) / UC Diagnoses   Final diagnoses:  None    ED Discharge Orders    None       Controlled Substance Prescriptions [place] Controlled Substance Registry consulted? Not Applicable   Bjorn Pippin, PA-C 02/23/17 1330

## 2017-07-12 IMAGING — DX DG CHEST 2V
2 series · 2 of 2 positions shown · non-contrast
Comparison: 11/15/2015

CLINICAL DATA: 29-year-old male with a history of a left-sided
spontaneous pneumothorax, status post removal of the chest tube

EXAM:
CHEST  2 VIEW

[chest pa]
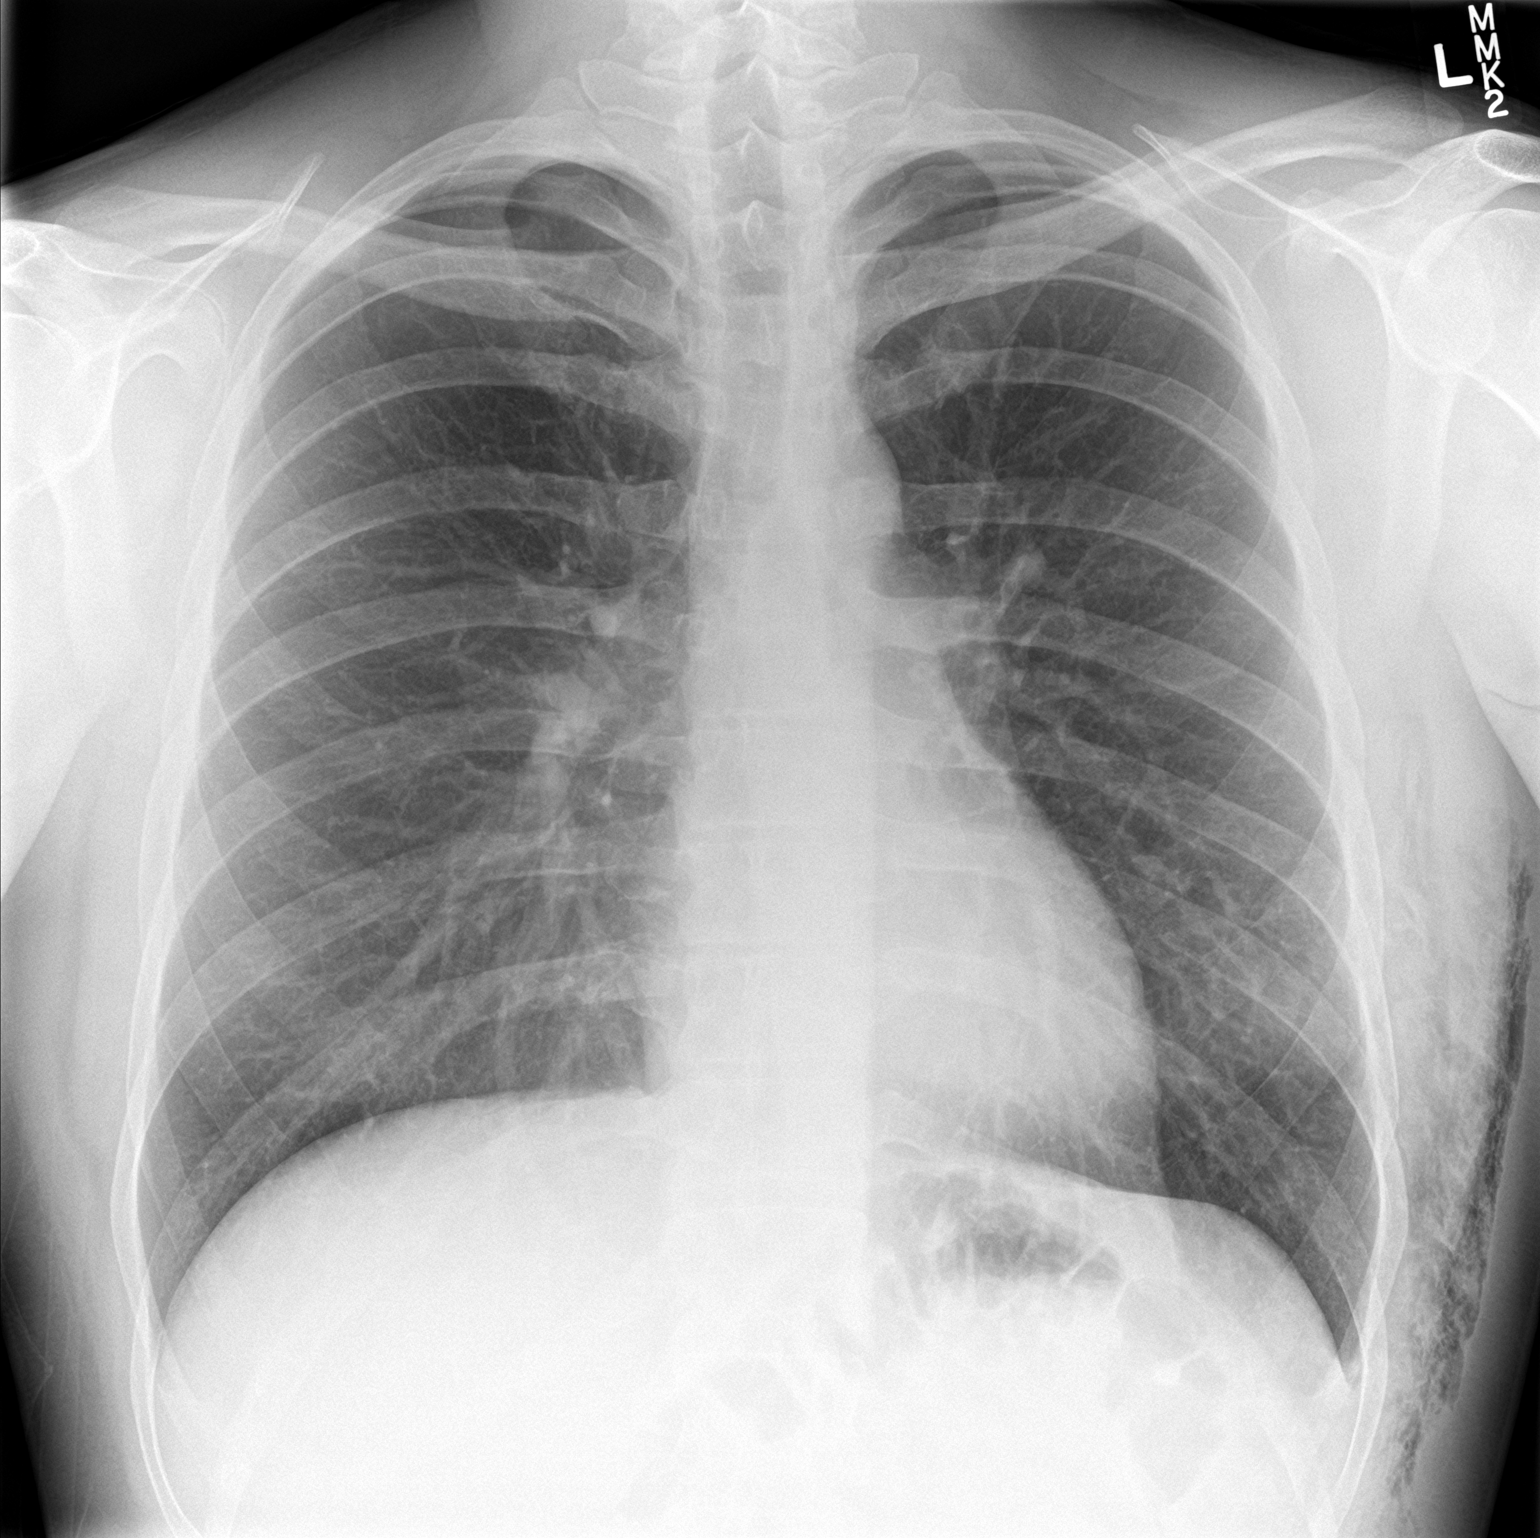

[chest lat]
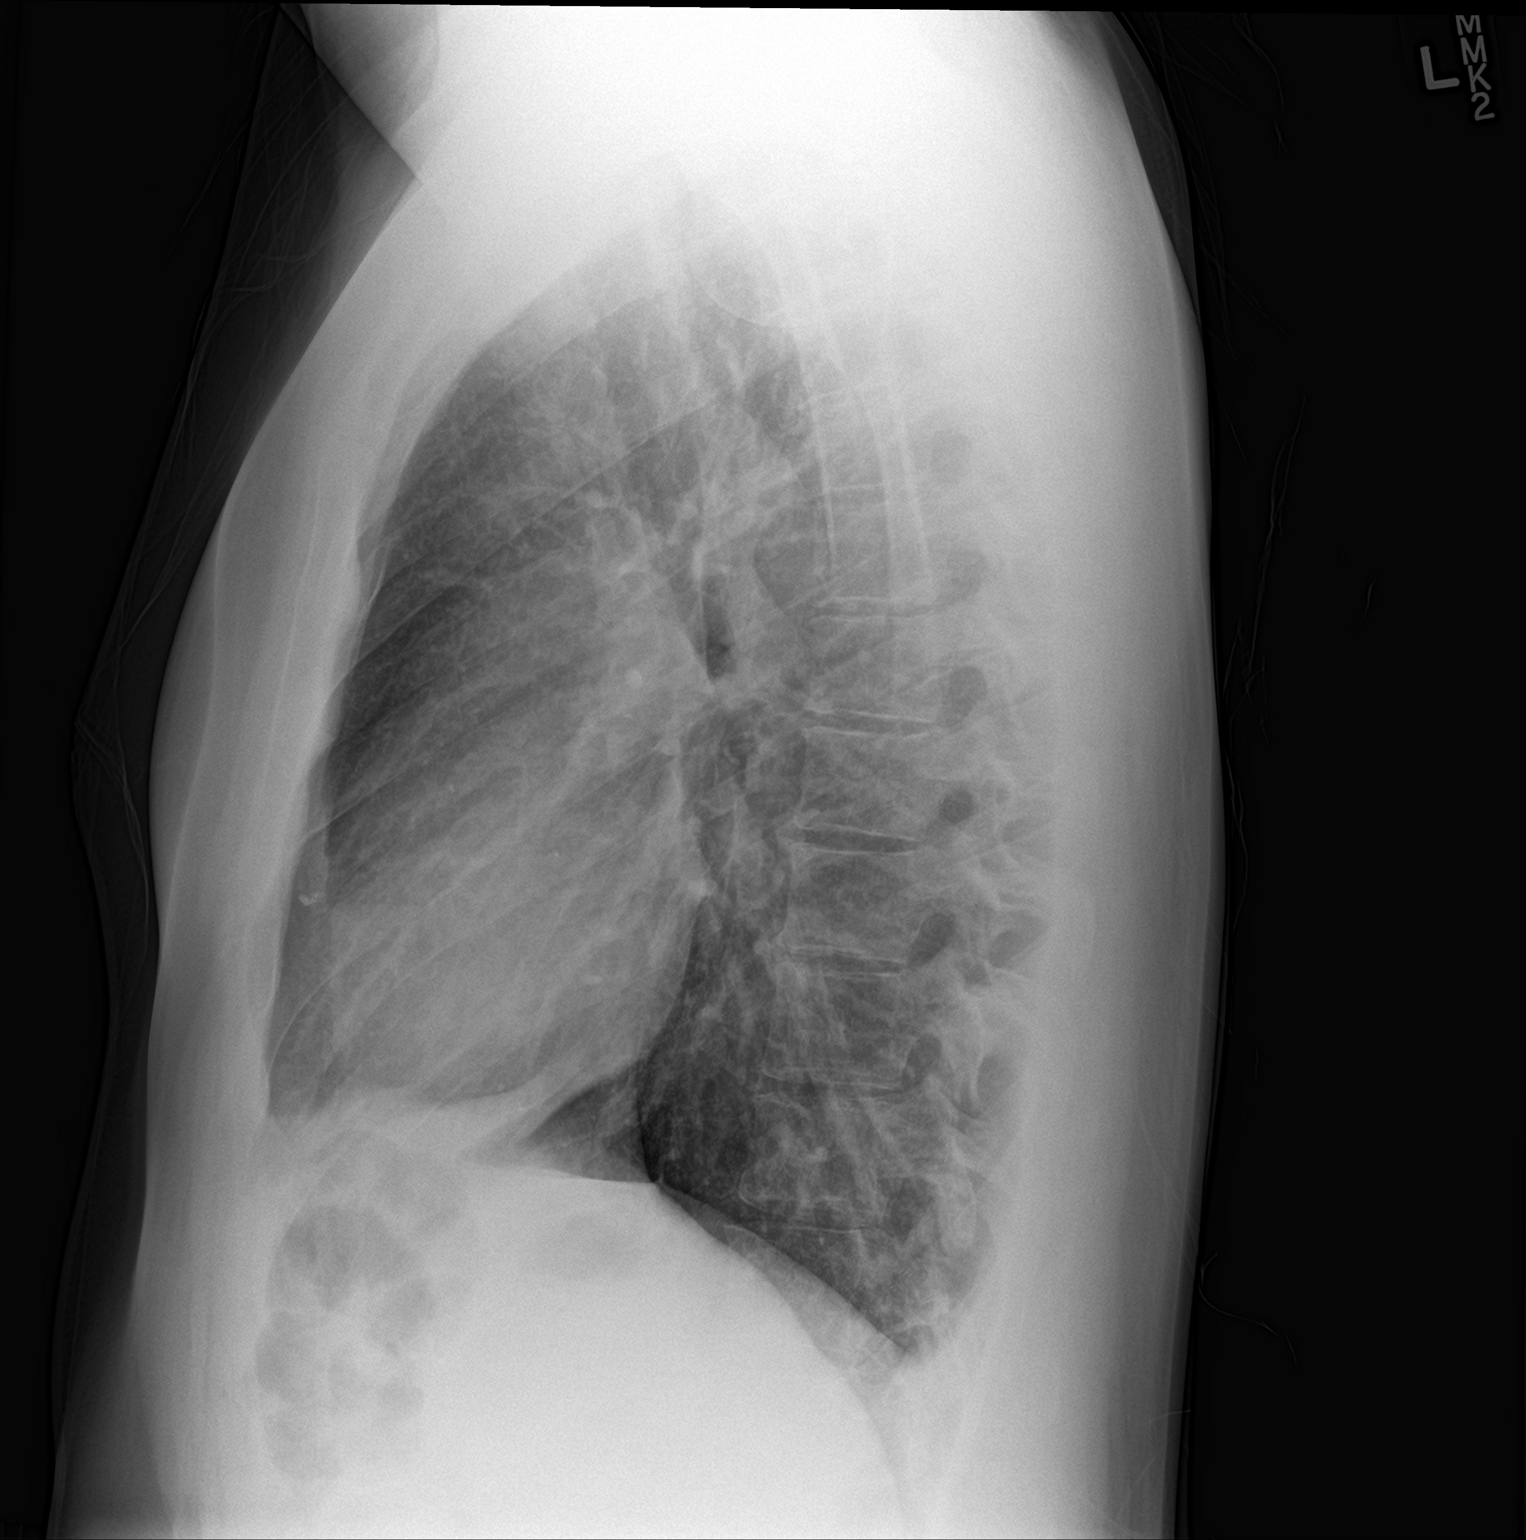

[2 of 2 positions shown; findings below may reference images not displayed]

FINDINGS: Cardiomediastinal silhouette unchanged.

No confluent airspace disease or pleural effusion.

Status post left-sided chest tube removal with no pneumothorax.

Gas within the subcutaneous tissues of the left chest.

No displaced fracture.
IMPRESSION: After removal of left-sided chest tube there is no visualized
pneumothorax.

Gas within the subcutaneous tissues of the left chest wall.

## 2017-07-12 IMAGING — DX DG CHEST 2V
2 series · 2 of 2 positions shown · non-contrast
Comparison: 11/14/2015

CLINICAL DATA: Left pneumothorax

EXAM:
CHEST  2 VIEW

[chest pa]
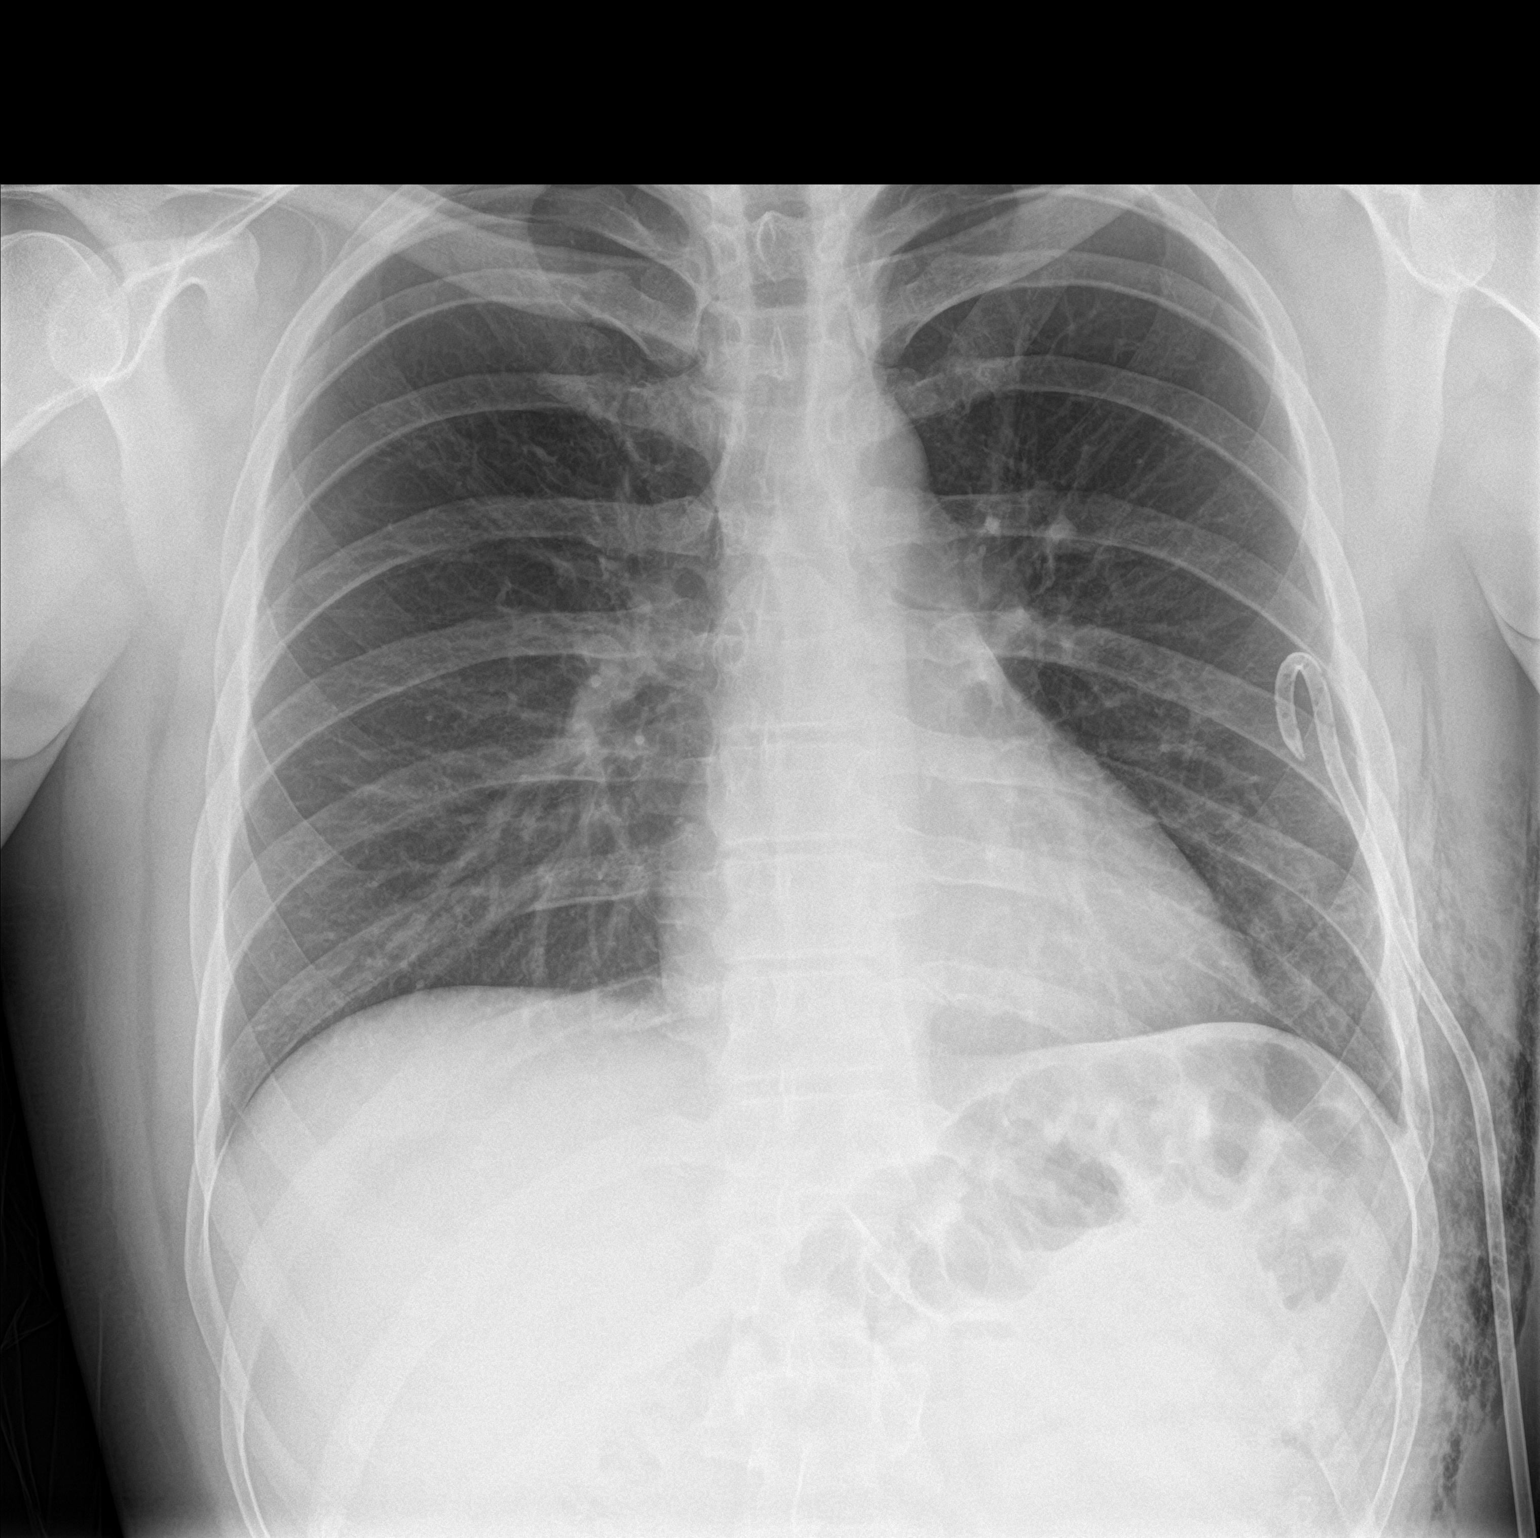

[chest lat]
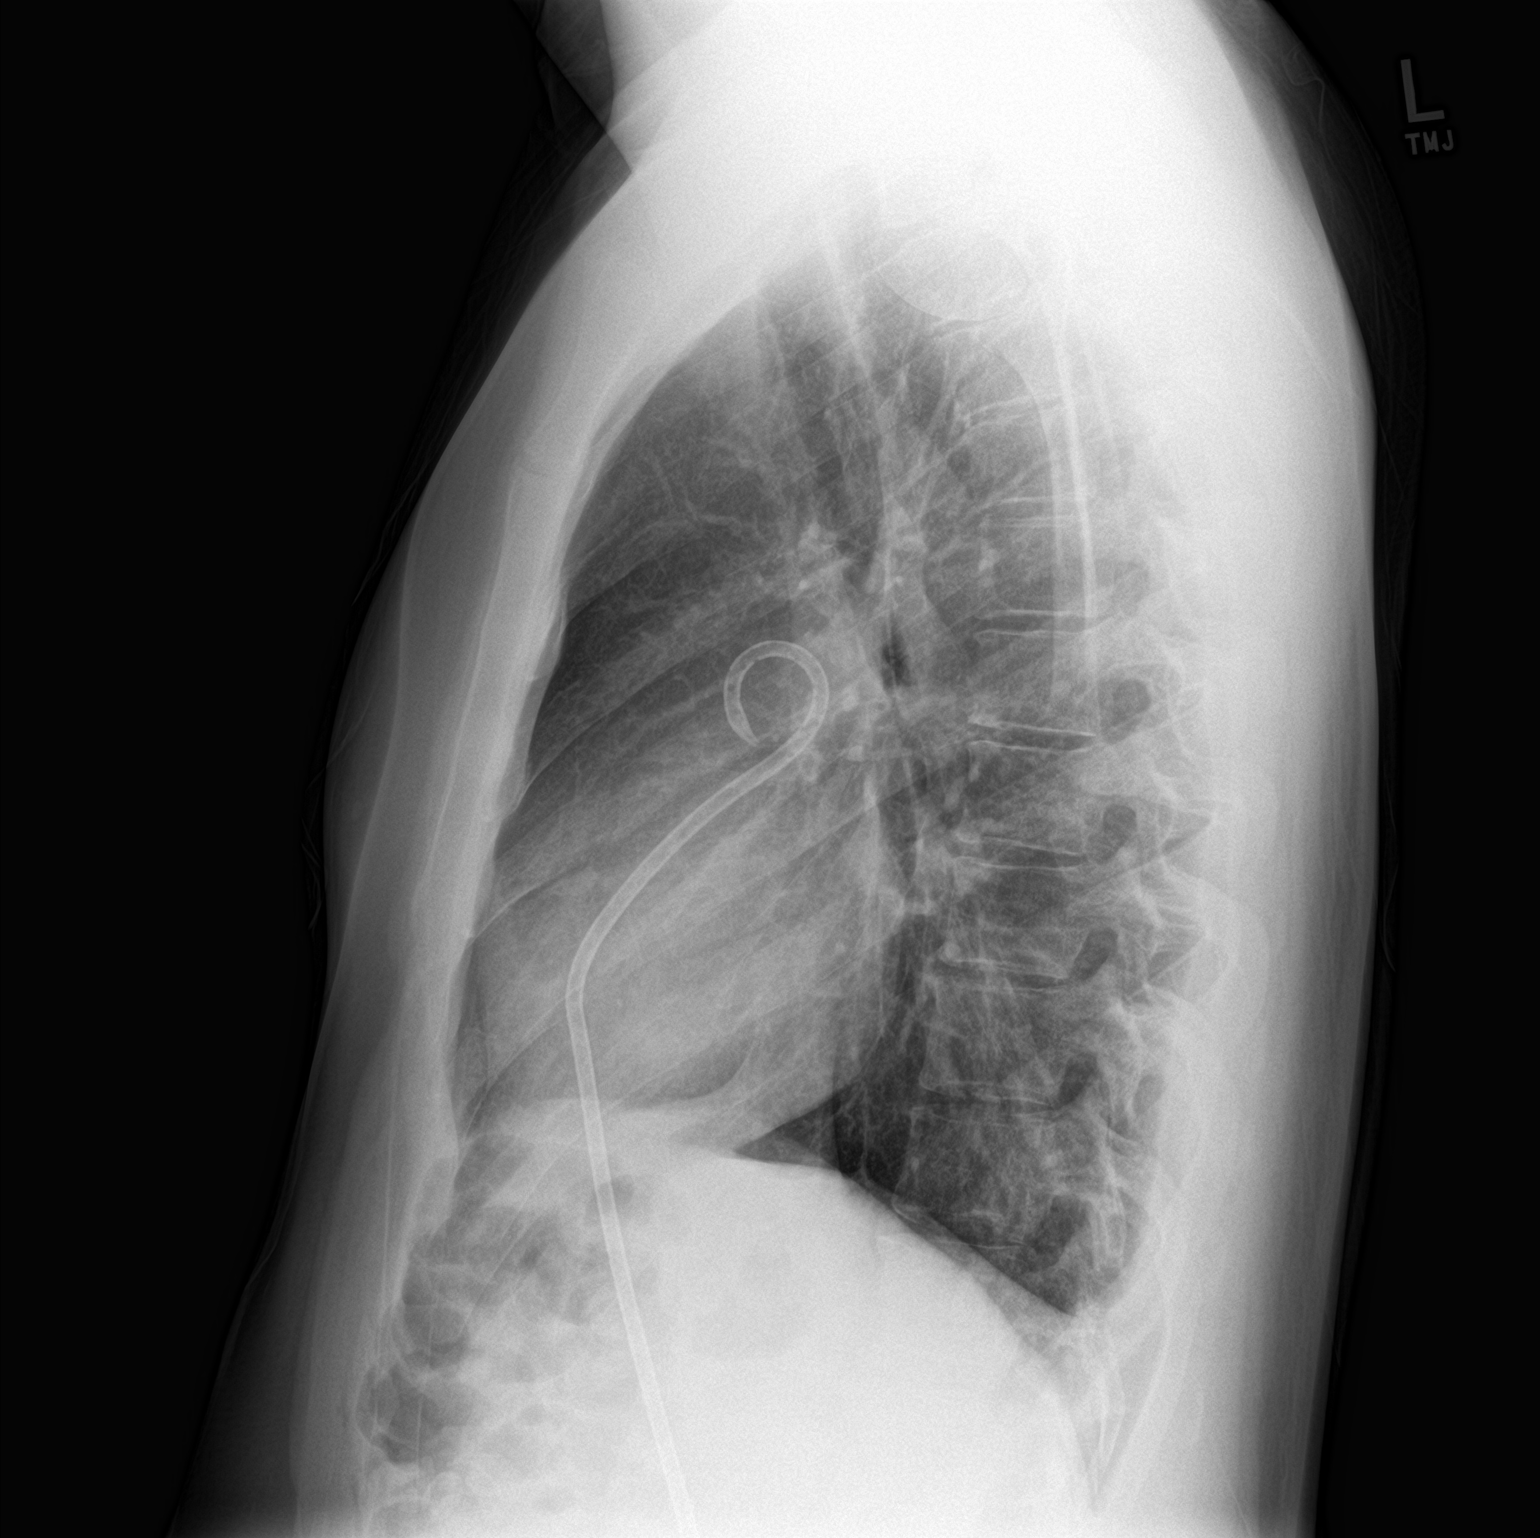

[2 of 2 positions shown; findings below may reference images not displayed]

FINDINGS: Stable left pigtail chest tube. No definite pneumothorax is seen.
Moderate subcutaneous emphysema along the left lateral chest wall,
unchanged.

Right lung is clear.

The heart is normal in size.
IMPRESSION: Stable left pigtail chest tube. No definite pneumothorax is seen.
Moderate subcutaneous emphysema.

## 2018-10-03 ENCOUNTER — Encounter (HOSPITAL_COMMUNITY): Payer: Self-pay

## 2018-10-03 ENCOUNTER — Ambulatory Visit (HOSPITAL_COMMUNITY)
Admission: EM | Admit: 2018-10-03 | Discharge: 2018-10-03 | Disposition: A | Discharge status: Home / Self Care | Payer: Self-pay | Attending: Family Medicine | Admitting: Family Medicine

## 2018-10-03 ENCOUNTER — Other Ambulatory Visit: Payer: Self-pay

## 2018-10-03 DIAGNOSIS — K047 Periapical abscess without sinus: Secondary | ICD-10-CM

## 2018-10-03 DIAGNOSIS — B360 Pityriasis versicolor: Secondary | ICD-10-CM

## 2018-10-03 MED ORDER — PENICILLIN V POTASSIUM 500 MG PO TABS: 500.0000 mg | ORAL_TABLET | Freq: Four times a day (QID) | ORAL | 0 refills | Status: AC

## 2018-10-03 MED ORDER — FLUCONAZOLE 150 MG PO TABS: 300.0000 mg | ORAL_TABLET | Freq: Every day | ORAL | 0 refills | Status: DC

## 2018-10-03 NOTE — ED Triage Notes (Signed)
Pt states he need some antibiotics for a abscess for his tooth. Pt states she has a rash on his chest , neck and shoulders. This has been going on for months.

## 2018-10-03 NOTE — Discharge Instructions (Addendum)
Treating for tinea versicolor with fluconazole. You will take 2 tabs or 300 mg once a week for 2 weeks I would also like for you to purchase some Selsun Blue over-the-counter and use this once or twice weekly.  Penicillin for the dental infection.  You can take ibuprofen or Aleve for pain Follow-up with the dentist as planned

## 2018-10-03 NOTE — ED Provider Notes (Addendum)
[place]    CSN: [id number] Arrival date & time: 10/03/18  1100     History   Chief Complaint Chief Complaint  Patient presents with  . Abscess  . Dental Pain    HPI Justin Lynch is a 33 y.o. male.   Patient is a 33 year old male that presents today with multiple complaints.  First complaint being right lower dental pain.  This has been constant over the past week.  He had abscess to the gum area and reports using warm compresses and drained  Abscess on his own.  He went to see a dentist and they told him he needed to have antibiotics to clear the infection before they would do any had a procedure on him.  He is here today for antibiotics.  Denies any significant pain.  He does get intermittent sharp pain shooting up the face into the head and jaw area.  Denies any associated fevers or trismus.  He has multiple dental caries and dental fractures.  He has removed teeth on his own before.  He also has diffuse rash to chest, back and neck area.  This is been present and worsening over the last 6 months or more.  Denies the rash as being painful or itching.  The rash will get red and inflamed in the heat. Denies any fever, joint pain. Denies any recent changes in lotions, detergents, foods or other possible irritants. No recent travel. Nobody else at home has the rash. Patient has been outside but denies any contact with plants or insects. No new foods or medications.   ROS per HPI       Past Medical History:  Diagnosis Date  . Brain tumor (benign) [facility])     Patient Active Problem List   Diagnosis Date Noted  . Pneumothorax on left   . Spontaneous pneumothorax 11/13/2015    Past Surgical History:  Procedure Laterality Date  . BRAIN SURGERY    . brain tumor removal         Home Medications    Prior to Admission medications   Medication Sig Start Date End Date Taking? Authorizing Provider  fluconazole (DIFLUCAN) 150 MG tablet Take 2 tablets (300 mg  total) by mouth daily. Take 2 tabs or 300 mg once weekly for 2 weeks. 10/03/18   Loura Halt A, NP  penicillin v potassium (VEETID) 500 MG tablet Take 1 tablet (500 mg total) by mouth 4 (four) times daily for 10 days. 10/03/18 10/13/18  Orvan July, NP    Family History History reviewed. No pertinent family history.  Social History Social History   Tobacco Use  . Smoking status: Former Smoker    Packs/day: 0.25    Years: 8.00    Pack years: 2.00    Types: Cigarettes    Quit date: 11/12/2015    Years since quitting: 2.8  . Smokeless tobacco: Never Used  Substance Use Topics  . Alcohol use: No  . Drug use: Yes    Types: Marijuana    Comment: daily     Allergies   Powder   Review of Systems Review of Systems   Physical Exam Triage Vital Signs ED Triage Vitals  Enc Vitals Group     BP 10/03/18 1124 (!) 142/89     Pulse Rate 10/03/18 1124 68     Resp 10/03/18 1124 18     Temp 10/03/18 1124 98.6 F (37 C)     Temp src --  SpO2 10/03/18 1124 97 %     Weight 10/03/18 1124 222 lb (100.7 kg)     Height --      Head Circumference --      Peak Flow --      Pain Score 10/03/18 1158 0     Pain Loc --      Pain Edu? --      Excl. in [place]? --    No data found.  Updated Vital Signs BP (!) 142/89 (BP Location: Right Arm)   Pulse 68   Temp 98.6 F (37 C)   Resp 18   Wt 222 lb (100.7 kg)   SpO2 97%   BMI 28.50 kg/m   Visual Acuity Right Eye Distance:   Left Eye Distance:   Bilateral Distance:    Right Eye Near:   Left Eye Near:    Bilateral Near:     Physical Exam Vitals signs and nursing note reviewed.  Constitutional:      General: He is not in acute distress.    Appearance: Normal appearance. He is not ill-appearing, toxic-appearing or diaphoretic.  HENT:     Head: Normocephalic and atraumatic.     Nose: Nose normal.     Mouth/Throat:     Lips: Pink.     Mouth: Mucous membranes are moist.     Dentition: Dental tenderness, gingival swelling and  dental caries present.     Pharynx: Oropharynx is clear.   Eyes:     Conjunctiva/sclera: Conjunctivae normal.  Neck:     Musculoskeletal: Normal range of motion.  Pulmonary:     Effort: Pulmonary effort is normal.  Musculoskeletal: Normal range of motion.  Skin:    General: Skin is warm and dry.     Findings: Rash present.     Comments: Pink plaques widespread to chest, back and neck area.    Neurological:     Mental Status: He is alert.  Psychiatric:        Mood and Affect: Mood normal.      UC Treatments / Results  Labs (all labs ordered are listed, but only abnormal results are displayed) Labs Reviewed - No data to display  EKG   Radiology No results found.  Procedures Procedures (including critical care time)  Medications Ordered in UC Medications - No data to display  Initial Impression / Assessment and Plan / UC Course  I have reviewed the triage vital signs and the nursing notes.  Pertinent labs & imaging results that were available during my care of the patient were reviewed by me and considered in my medical decision making (see chart for details).     Dental infection- treating with PCN, OTC meds for pain Follow up with dentist  Tinea versicolor- treating with fluconazole 300 mg once weekly x 2 weeks.  Selsun blue once or twice a week Follow up as needed for continued or worsening symptoms  Final Clinical Impressions(s) / UC Diagnoses   Final diagnoses:  Tinea versicolor  Dental infection     Discharge Instructions     Treating for tinea versicolor with fluconazole. You will take 2 tabs or 300 mg once a week for 2 weeks I would also like for you to purchase some Selsun Blue over-the-counter and use this once or twice weekly.  Penicillin for the dental infection.  You can take ibuprofen or Aleve for pain Follow-up with the dentist as planned    ED Prescriptions    Medication Sig Dispense Auth.  Provider   penicillin v potassium  (VEETID) 500 MG tablet Take 1 tablet (500 mg total) by mouth 4 (four) times daily for 10 days. 40 tablet Aarna Mihalko A, NP   fluconazole (DIFLUCAN) 150 MG tablet Take 2 tablets (300 mg total) by mouth daily. Take 2 tabs or 300 mg once weekly for 2 weeks. 4 tablet Loura Halt A, NP     Controlled Substance Prescriptions [place] Controlled Substance Registry consulted? Not Applicable       Orvan July, NP 10/03/18 1324

## 2021-03-30 ENCOUNTER — Ambulatory Visit (HOSPITAL_COMMUNITY)
Admission: EM | Admit: 2021-03-30 | Discharge: 2021-03-30 | Disposition: A | Discharge status: Home / Self Care | Payer: Self-pay | Attending: Family Medicine | Admitting: Family Medicine

## 2021-03-30 ENCOUNTER — Other Ambulatory Visit: Payer: Self-pay

## 2021-03-30 ENCOUNTER — Encounter (HOSPITAL_COMMUNITY): Payer: Self-pay

## 2021-03-30 DIAGNOSIS — B360 Pityriasis versicolor: Secondary | ICD-10-CM

## 2021-03-30 MED ORDER — FLUCONAZOLE 100 MG PO TABS: ORAL_TABLET | ORAL | 0 refills | Status: DC

## 2021-03-30 NOTE — ED Triage Notes (Signed)
Pt rash to upper abdomen/chest/neck/legs for over 2 months or more.

## 2021-03-31 NOTE — ED Provider Notes (Signed)
°  [place]   [id number] 03/30/21 Arrival Time: 2671  ASSESSMENT & PLAN:  1. Tinea versicolor    Begin: Meds ordered this encounter  Medications   fluconazole (DIFLUCAN) 100 MG tablet    Sig: Take 3 tablets by mouth as a single dose. Repeat in one week.    Dispense:  6 tablet    Refill:  0   No signs of bacterial skin infection.  Will follow up with PCP or here if worsening or failing to improve as anticipated. Reviewed expectations re: course of current medical issues. Questions answered. Outlined signs and symptoms indicating need for more acute intervention. Patient verbalized understanding. After Visit Summary given.   SUBJECTIVE:  Justin Lynch is a 36 y.o. male who presents with a skin complaint. "Rash" over trunk mainly; months if not longer. No itching or pain. No new exposures. No known trigger. No tx PTA.   OBJECTIVE: Vitals:   03/30/21 1411  BP: (!) 157/82  Pulse: 68  Resp: 18  Temp: 98.6 F (37 C)  TempSrc: Oral  SpO2: 97%    General appearance: alert; no distress HEENT: [place]; AT Neck: supple with FROM Lungs: clear to auscultation bilaterally Heart: regular rate and rhythm Extremities: no edema; moves all extremities normally Skin: warm and dry; coppery brown pale pink flaky discoloured large patches over his chest, abd, and back Psychological: alert and cooperative; normal mood and affect  Allergies  Allergen Reactions   Powder Swelling and Rash    Powder in gloves    Past Medical History:  Diagnosis Date   Brain tumor (benign) (HCC)    Social History   Socioeconomic History   Marital status: Single    Spouse name: Not on file   Number of children: Not on file   Years of education: Not on file   Highest education level: Not on file  Occupational History   Not on file  Tobacco Use   Smoking status: Former    Packs/day: 0.25    Years: 8.00    Pack years: 2.00    Types: Cigarettes    Quit date: 11/12/2015    Years  since quitting: 5.3   Smokeless tobacco: Never  Vaping Use   Vaping Use: Never used  Substance and Sexual Activity   Alcohol use: No   Drug use: Not Currently    Types: Marijuana    Comment: daily   Sexual activity: Not on file  Other Topics Concern   Not on file  Social History Narrative   Not on file   Social Determinants of Health   Financial Resource Strain: Not on file  Food Insecurity: Not on file  Transportation Needs: Not on file  Physical Activity: Not on file  Stress: Not on file  Social Connections: Not on file  Intimate Partner Violence: Not on file   History reviewed. No pertinent family history. Past Surgical History:  Procedure Laterality Date   BRAIN SURGERY     brain tumor removal        Vanessa Kick, MD 03/31/21 [phone]

## 2022-06-05 ENCOUNTER — Ambulatory Visit
Admission: EM | Admit: 2022-06-05 | Discharge: 2022-06-05 | Disposition: A | Discharge status: Home / Self Care | Payer: Self-pay | Attending: Physician Assistant | Admitting: Physician Assistant

## 2022-06-05 DIAGNOSIS — B360 Pityriasis versicolor: Secondary | ICD-10-CM

## 2022-06-05 MED ORDER — FLUCONAZOLE 100 MG PO TABS: ORAL_TABLET | ORAL | 1 refills | Status: AC

## 2022-06-05 NOTE — Discharge Instructions (Addendum)
Return if any problems.

## 2022-06-05 NOTE — ED Provider Notes (Signed)
[facility]    CSN: [id number] Arrival date & time: 06/05/22  0947      History   Chief Complaint Chief Complaint  Patient presents with   Rash    HPI Justin Lynch is a 37 y.o. male.   Patient complains of a full body rash.  Patient reports he has been treated for similar in the past.  Patient reports he has no relief when he uses [employer].  The history is provided by the patient. No language interpreter was used.  Rash Location:  Full body Quality: itchiness and redness   Severity:  Moderate Onset quality:  Gradual Timing:  Constant Progression:  Worsening Chronicity:  New Relieved by:  Nothing Ineffective treatments:  None tried Associated symptoms: no nausea     Past Medical History:  Diagnosis Date   Brain tumor (benign) [facility])     Patient Active Problem List   Diagnosis Date Noted   Pneumothorax on left    Spontaneous pneumothorax 11/13/2015    Past Surgical History:  Procedure Laterality Date   BRAIN SURGERY     brain tumor removal         Home Medications    Prior to Admission medications   Medication Sig Start Date End Date Taking? Authorizing Provider  fluconazole (DIFLUCAN) 100 MG tablet Take 3 tablets by mouth as a single dose. Repeat in one week. 03/30/21   Vanessa Kick, MD    Family History History reviewed. No pertinent family history.  Social History Social History   Tobacco Use   Smoking status: Former    Packs/day: 0.25    Years: 8.00    Additional pack years: 0.00    Total pack years: 2.00    Types: Cigarettes    Quit date: 11/12/2015    Years since quitting: 6.5   Smokeless tobacco: Never  Vaping Use   Vaping Use: Never used  Substance Use Topics   Alcohol use: No   Drug use: Not Currently    Types: Marijuana    Comment: daily     Allergies   Powder   Review of Systems Review of Systems  Gastrointestinal:  Negative for nausea.  Skin:  Positive for rash.  All other systems reviewed and  are negative.    Physical Exam Triage Vital Signs ED Triage Vitals  Enc Vitals Group     BP 06/05/22 1126 (!) 155/90     Pulse Rate 06/05/22 1126 61     Resp 06/05/22 1126 18     Temp 06/05/22 1126 98.2 F (36.8 C)     Temp Source 06/05/22 1126 Oral     SpO2 06/05/22 1126 99 %     Weight --      Height --      Head Circumference --      Peak Flow --      Pain Score 06/05/22 1128 0     Pain Loc --      Pain Edu? --      Excl. in [place]? --    No data found.  Updated Vital Signs BP (!) 155/90 (BP Location: Left Arm)   Pulse 61   Temp 98.2 F (36.8 C) (Oral)   Resp 18   SpO2 99%   Visual Acuity Right Eye Distance:   Left Eye Distance:   Bilateral Distance:    Right Eye Near:   Left Eye Near:    Bilateral Near:     Physical Exam Vitals and  nursing note reviewed.  Constitutional:      Appearance: He is well-developed.  HENT:     Head: Normocephalic.  Cardiovascular:     Rate and Rhythm: Normal rate.  Pulmonary:     Effort: Pulmonary effort is normal.  Abdominal:     General: There is no distension.  Musculoskeletal:        General: Normal range of motion.     Cervical back: Normal range of motion.  Skin:    Findings: Erythema and rash present.  Neurological:     General: No focal deficit present.     Mental Status: He is alert and oriented to person, place, and time.      UC Treatments / Results  Labs (all labs ordered are listed, but only abnormal results are displayed) Labs Reviewed - No data to display  EKG   Radiology No results found.  Procedures Procedures (including critical care time)  Medications Ordered in UC Medications - No data to display  Initial Impression / Assessment and Plan / UC Course  I have reviewed the triage vital signs and the nursing notes.  Pertinent labs & imaging results that were available during my care of the patient were reviewed by me and considered in my medical decision making (see chart for  details).     MDM: Given a prescription for Diflucan.  And is given phone number for dermatology to schedule follow-up if needed Final Clinical Impressions(s) / UC Diagnoses   Final diagnoses:  Tinea versicolor   Discharge Instructions   None    ED Prescriptions   None    PDMP not reviewed this encounter. An After Visit Summary was printed and given to the patient.        Fransico Meadow, [place] 06/05/22 1152

## 2022-06-05 NOTE — ED Triage Notes (Signed)
Pt presents for recurrent rash on chest with no complaints of itchiness or pain.
